# Patient Record
Sex: Female | Born: 1990 | Race: White | Hispanic: Yes | Marital: Married | Smoking: Never smoker
Health system: Southern US, Community
[De-identification: ages and names within clinical notes are randomized; demographics above are authoritative.]

## PROBLEM LIST (undated history)

## (undated) ENCOUNTER — Inpatient Hospital Stay (HOSPITAL_COMMUNITY): Payer: Self-pay

## (undated) DIAGNOSIS — T8331XA Breakdown (mechanical) of intrauterine contraceptive device, initial encounter: Secondary | ICD-10-CM

## (undated) DIAGNOSIS — O9989 Other specified diseases and conditions complicating pregnancy, childbirth and the puerperium: Secondary | ICD-10-CM

## (undated) DIAGNOSIS — A5901 Trichomonal vulvovaginitis: Secondary | ICD-10-CM

## (undated) DIAGNOSIS — K219 Gastro-esophageal reflux disease without esophagitis: Secondary | ICD-10-CM

## (undated) DIAGNOSIS — K8020 Calculus of gallbladder without cholecystitis without obstruction: Secondary | ICD-10-CM

## (undated) HISTORY — DX: Other specified diseases and conditions complicating pregnancy, childbirth and the puerperium: O99.89

## (undated) HISTORY — DX: Breakdown (mechanical) of intrauterine contraceptive device, initial encounter: T83.31XA

## (undated) HISTORY — DX: Trichomonal vulvovaginitis: A59.01

---

## 2017-01-04 DIAGNOSIS — T8331XA Breakdown (mechanical) of intrauterine contraceptive device, initial encounter: Secondary | ICD-10-CM

## 2017-01-04 DIAGNOSIS — Z331 Pregnant state, incidental: Secondary | ICD-10-CM

## 2017-01-04 HISTORY — DX: Breakdown (mechanical) of intrauterine contraceptive device, initial encounter: T83.31XA

## 2017-01-04 HISTORY — DX: Pregnant state, incidental: Z33.1

## 2017-01-06 ENCOUNTER — Inpatient Hospital Stay (HOSPITAL_COMMUNITY)
Admission: AD | Admit: 2017-01-06 | Discharge: 2017-01-06 | Disposition: A | Discharge status: Home / Self Care | Payer: Medicaid Other | Source: Ambulatory Visit | Attending: Obstetrics & Gynecology | Admitting: Obstetrics & Gynecology

## 2017-01-06 ENCOUNTER — Encounter (HOSPITAL_COMMUNITY): Payer: Self-pay | Admitting: *Deleted

## 2017-01-06 ENCOUNTER — Inpatient Hospital Stay (HOSPITAL_COMMUNITY): Payer: Medicaid Other

## 2017-01-06 DIAGNOSIS — Z30432 Encounter for removal of intrauterine contraceptive device: Secondary | ICD-10-CM

## 2017-01-06 DIAGNOSIS — Z3A01 Less than 8 weeks gestation of pregnancy: Secondary | ICD-10-CM | POA: Diagnosis not present

## 2017-01-06 DIAGNOSIS — Z3491 Encounter for supervision of normal pregnancy, unspecified, first trimester: Secondary | ICD-10-CM

## 2017-01-06 DIAGNOSIS — Z3401 Encounter for supervision of normal first pregnancy, first trimester: Secondary | ICD-10-CM | POA: Diagnosis not present

## 2017-01-06 DIAGNOSIS — Z3201 Encounter for pregnancy test, result positive: Secondary | ICD-10-CM | POA: Diagnosis not present

## 2017-01-06 DIAGNOSIS — T8331XA Breakdown (mechanical) of intrauterine contraceptive device, initial encounter: Secondary | ICD-10-CM

## 2017-01-06 DIAGNOSIS — O9989 Other specified diseases and conditions complicating pregnancy, childbirth and the puerperium: Secondary | ICD-10-CM | POA: Diagnosis not present

## 2017-01-06 DIAGNOSIS — O09891 Supervision of other high risk pregnancies, first trimester: Secondary | ICD-10-CM

## 2017-01-06 DIAGNOSIS — M549 Dorsalgia, unspecified: Secondary | ICD-10-CM | POA: Diagnosis present

## 2017-01-06 LAB — COMPREHENSIVE METABOLIC PANEL
ALK PHOS: 70 U/L (ref 38–126)
ALT: 24 U/L (ref 14–54)
ANION GAP: 8 (ref 5–15)
AST: 21 U/L (ref 15–41)
Albumin: 4 g/dL (ref 3.5–5.0)
BILIRUBIN TOTAL: 0.5 mg/dL (ref 0.3–1.2)
BUN: 11 mg/dL (ref 6–20)
CALCIUM: 9.2 mg/dL (ref 8.9–10.3)
CO2: 25 mmol/L (ref 22–32)
CREATININE: 0.59 mg/dL (ref 0.44–1.00)
Chloride: 101 mmol/L (ref 101–111)
Glucose, Bld: 101 mg/dL — ABNORMAL HIGH (ref 65–99)
Potassium: 4.2 mmol/L (ref 3.5–5.1)
Sodium: 134 mmol/L — ABNORMAL LOW (ref 135–145)
TOTAL PROTEIN: 7.8 g/dL (ref 6.5–8.1)

## 2017-01-06 LAB — URINALYSIS, ROUTINE W REFLEX MICROSCOPIC
Bilirubin Urine: NEGATIVE
GLUCOSE, UA: NEGATIVE mg/dL
KETONES UR: NEGATIVE mg/dL
NITRITE: NEGATIVE
Protein, ur: NEGATIVE mg/dL
SPECIFIC GRAVITY, URINE: 1.003 — AB (ref 1.005–1.030)
pH: 6 (ref 5.0–8.0)

## 2017-01-06 LAB — CBC WITH DIFFERENTIAL/PLATELET
BASOS PCT: 0 %
Basophils Absolute: 0 10*3/uL (ref 0.0–0.1)
Eosinophils Absolute: 0 10*3/uL (ref 0.0–0.7)
Eosinophils Relative: 0 %
HEMATOCRIT: 35.9 % — AB (ref 36.0–46.0)
Hemoglobin: 11.2 g/dL — ABNORMAL LOW (ref 12.0–15.0)
LYMPHS ABS: 1.5 10*3/uL (ref 0.7–4.0)
LYMPHS PCT: 17 %
MCH: 24.2 pg — ABNORMAL LOW (ref 26.0–34.0)
MCHC: 31.2 g/dL (ref 30.0–36.0)
MCV: 77.5 fL — AB (ref 78.0–100.0)
MONO ABS: 0.4 10*3/uL (ref 0.1–1.0)
MONOS PCT: 4 %
NEUTROS ABS: 7.3 10*3/uL (ref 1.7–7.7)
Neutrophils Relative %: 79 %
Platelets: 268 10*3/uL (ref 150–400)
RBC: 4.63 MIL/uL (ref 3.87–5.11)
RDW: 15.2 % (ref 11.5–15.5)
WBC: 9.3 10*3/uL (ref 4.0–10.5)

## 2017-01-06 LAB — POCT PREGNANCY, URINE: Preg Test, Ur: POSITIVE — AB

## 2017-01-06 LAB — HCG, QUANTITATIVE, PREGNANCY: HCG, BETA CHAIN, QUANT, S: 21400 m[IU]/mL — AB (ref <5)

## 2017-01-06 LAB — ABO/RH: ABO/RH(D): A POS

## 2017-01-06 MED ORDER — AZITHROMYCIN 250 MG PO TABS
500.0000 mg | ORAL_TABLET | Freq: Once | ORAL | Status: AC
  Administered 2017-01-06: 500 mg via ORAL
  Filled 2017-01-06: qty 2

## 2017-01-06 NOTE — MAU Note (Signed)
[place]  Has been checking strings and last felt it 2 weeks ago.  Has had periods with LMP 11/20/16.  Had a +pregnancy test done on Tuesday and again today at [facility].  C/o constant lower back pain she rates a 7/10 and intermittent left lower abdominal pain she rates a 4/10.  Denies vaginal bleeding, but states having some clear/white vaginal discharge.  No vaginal pain.

## 2017-01-06 NOTE — MAU Provider Note (Signed)
History     CSN: [id number]  Arrival date and time: 01/06/17 1229   None     Chief Complaint  Patient presents with  . Possible Pregnancy  . Contraception   26 yo with IUD in place and positive pregnancy test.   Had IUDput in in September of last year and has not been able to feel the strings in over 3 weeks. She is reporting 4/10  back pain and cramping. She reports that the cramping started this week. Positive UPT at home 6 days ago and today. No bleeding, but clear discharge.she has not seen the IUD come out. She is very annoyed that she might be pregnant duspite the IUD.   She has not tried any treatments for pain. It is associated with nausea but no vomiting.  OB History    Gravida Para Term Preterm AB Living   2 1 1  0 0 1   SAB TAB Ectopic Multiple Live Births   0 0 0 0 0      History reviewed. No pertinent past medical history.  History reviewed. No pertinent surgical history.  History reviewed. No pertinent family history.  Social History  Substance Use Topics  . Smoking status: Never Smoker  . Smokeless tobacco: Never Used  . Alcohol use No    Allergies: No Known Allergies  No prescriptions prior to admission.    Review of Systems  Constitutional: Negative for chills and fever.  Gastrointestinal: Positive for nausea. Negative for abdominal pain, blood in stool, diarrhea and vomiting.  Genitourinary: Positive for frequency. Negative for dysuria, urgency, vaginal bleeding, vaginal discharge and vaginal pain.  Musculoskeletal: Positive for back pain.  Neurological: Negative for dizziness, light-headedness and headaches.   Physical Exam   Blood pressure 120/81, pulse 93, temperature 98.4 F (36.9 C), temperature source Oral, resp. rate 18, last menstrual period 11/20/2016, SpO2 98 %.  Physical Exam  Constitutional: She is oriented to person, place, and time. She appears well-developed and well-nourished.  Eyes: Pupils are equal, round, and reactive to  light.  Cardiovascular: Regular rhythm and normal heart sounds.   Respiratory: Effort normal and breath sounds normal.  GI: Soft. Bowel sounds are normal. She exhibits no distension and no mass. There is no tenderness. There is no rebound and no guarding.  Genitourinary: No labial fusion. There is no rash, tenderness, lesion or injury on the right labia. There is no rash, tenderness, lesion or injury on the left labia. Cervix exhibits discharge. Cervix exhibits no motion tenderness and no friability. No erythema, tenderness or bleeding in the vagina. No foreign body in the vagina. No signs of injury around the vagina. No vaginal discharge found.  Neurological: She is alert and oriented to person, place, and time.  Skin: Skin is warm and dry.  Psychiatric: She has a normal mood and affect. Her behavior is normal. Judgment and thought content normal.    MAU Course  Procedures  MDM CBC microcytic anemia Hb 11.2 US 2729w5d IUP with IUD seen in the lower uterine segment. BHCG quant 21,400 ABORH A+ IUD removed Given one time dose of azithromycin   Assessment and Plan  Pt is 26 yo presenting with IUP despite use of ParaGard IUD. IUD was removed, and pt provided with azithromycin in the ER , for prophylaxis. Pt appears well and stable and is not at any acute risk. Pt to start prenatal care as desired. List of providers given. Pt was counseled on the increased risk for miscarriage and miscarriage precautions  were provided. Return precautions were provided.   Sullivan LoneBrannon L Inman 01/06/2017, 1:19 PM   MDM: I confirm that I have verified the information documented in the medical student's note and that I have also personally performed the physical exam and all medical decision making activities.  IUD Removal  Patient was in the dorsal lithotomy position, normal external genitalia was noted.  A speculum was placed in the patient's vagina, normal discharge was noted, no lesions. The multiparous cervix was  visualized, no lesions, no abnormal discharge.  The strings of the IUD were grasped and pulled using ring forceps. The IUD was removed in its entirety. Patient tolerated the procedure well.    Reviewed risks of miscarriage with pt, but risks reduced by removing IUD.  Pt to start prenatal care with provider of her choice.  A: 1. Normal IUP (intrauterine pregnancy) on prenatal ultrasound, first trimester   2. IUD pregnancy   3. Pregnancy occurring while using intrauterine contraceptive device (IUD) in first trimester     P: D/C home with miscarriage precautions Return to MAU as needed for emergencies   Sharen CounterLisa Leftwich-Kirby, CNM 8:48 PM

## 2017-01-06 NOTE — Discharge Instructions (Signed)
[facility] Providers  ° ° °[facility]       Phone: [phone] ° °[facility] Phone: [phone] ° °[facility] [place]  Phone: [phone] ° °[facility]  Phone: [phone] ° °[facility]  Phone: [phone] ° °[facility]       Phone: [phone] ° °[facility]    Phone: [phone]  ° °[facility]    Phone: [phone] ° °[facility]    Phone: [phone] ° °[facility]    Phone: [phone] ° °[facility]    Phone: [phone] ° °[facility]       Phone: [phone]  ° °[facility]  Phone: [phone] ° °[facility]    Phone: [phone] ° °[facility]   Phone: [phone] ° °[facility]      Phone: [phone] ° °[facility]    Phone: [phone] ° °

## 2017-01-08 LAB — CULTURE, OB URINE

## 2017-03-02 ENCOUNTER — Encounter: Payer: Self-pay | Admitting: Certified Nurse Midwife

## 2017-03-02 ENCOUNTER — Ambulatory Visit (INDEPENDENT_AMBULATORY_CARE_PROVIDER_SITE_OTHER): Payer: Medicaid Other | Admitting: Certified Nurse Midwife

## 2017-03-02 ENCOUNTER — Other Ambulatory Visit (HOSPITAL_COMMUNITY)
Admission: RE | Admit: 2017-03-02 | Discharge: 2017-03-02 | Disposition: A | Discharge status: Home / Self Care | Payer: Medicaid Other | Source: Ambulatory Visit | Attending: Certified Nurse Midwife | Admitting: Certified Nurse Midwife

## 2017-03-02 VITALS — BP 110/73 | HR 105 | Ht 62.0 in | Wt 196.6 lb

## 2017-03-02 DIAGNOSIS — R87629 Unspecified abnormal cytological findings in specimens from vagina: Secondary | ICD-10-CM | POA: Insufficient documentation

## 2017-03-02 DIAGNOSIS — Z113 Encounter for screening for infections with a predominantly sexual mode of transmission: Secondary | ICD-10-CM | POA: Diagnosis not present

## 2017-03-02 DIAGNOSIS — Z3480 Encounter for supervision of other normal pregnancy, unspecified trimester: Secondary | ICD-10-CM

## 2017-03-02 DIAGNOSIS — Z23 Encounter for immunization: Secondary | ICD-10-CM

## 2017-03-02 DIAGNOSIS — Z3481 Encounter for supervision of other normal pregnancy, first trimester: Secondary | ICD-10-CM | POA: Diagnosis present

## 2017-03-02 DIAGNOSIS — Z3A00 Weeks of gestation of pregnancy not specified: Secondary | ICD-10-CM | POA: Insufficient documentation

## 2017-03-02 DIAGNOSIS — Z124 Encounter for screening for malignant neoplasm of cervix: Secondary | ICD-10-CM

## 2017-03-02 DIAGNOSIS — N879 Dysplasia of cervix uteri, unspecified: Secondary | ICD-10-CM | POA: Insufficient documentation

## 2017-03-02 DIAGNOSIS — Z3689 Encounter for other specified antenatal screening: Secondary | ICD-10-CM

## 2017-03-02 DIAGNOSIS — O0990 Supervision of high risk pregnancy, unspecified, unspecified trimester: Secondary | ICD-10-CM | POA: Insufficient documentation

## 2017-03-02 NOTE — Progress Notes (Signed)
FLU SHOT TODAY   NEW OB PACKET GIVEN BREAST EDUCATION DISCUSS LAST PAP SEPT2017 PH[facility] COMPLETED AND BILLED

## 2017-03-02 NOTE — Progress Notes (Signed)
Subjective:  Brittany Kerr is a 26 y.o. G3P2001 at [redacted]w[redacted]d being seen today for initial prenatal care.  She is currently monitored for the following issues for this low-risk pregnancy and has Supervision of other normal pregnancy, antepartum and Abnormal vaginal Pap smear on her problem list.  Patient reports mornig sickness, no vomiting.   .  .  Movement: Present. Denies leaking of fluid.   The following portions of the patient's history were reviewed and updated as appropriate: allergies, current medications, past family history, past medical history, past social history, past surgical history and problem list. Problem list updated.  Objective:   Vitals:   03/02/17 0936 03/02/17 0936  BP: 110/73   Pulse: (!) 105   Weight: 196 lb 9.6 oz (89.2 kg)   Height:   (1.575 m)    Fetal Status: Fetal Heart Rate (bpm): 160   Movement: Present     General:  Alert, oriented and cooperative. Patient is in no acute distress.  Skin: Skin is warm and dry. No rash noted.   Cardiovascular: RRR  Respiratory: CTAB  Abdomen: Soft, gravid, appropriate for gestational age. Pain/Pressure: Absent     Pelvic:       Cervical exam deferred        Extremities: Normal range of motion.     Mental Status: Normal mood and affect. Normal behavior. Normal judgment and thought content.  Breasts: breasts appear normal, no suspicious masses, no skin or nipple changes or axillary nodes, right breast normal without mass, skin or nipple changes or axillary nodes, left breast normal without mass, skin or nipple changes or axillary nodes.  Urinalysis:      Assessment and Plan:  Pregnancy: G3P2001 at [redacted]w[redacted]d  1. Supervision of other normal pregnancy, antepartum - Obstetric Panel, Including HIV - Hemoglobinopathy Evaluation - Culture, OB Urine - Cytology - PAP - [place] MFM OB COMP + 14 WK; Future - Babyscripts Schedule Optimization - Enroll Patient in Babyscripts  2. Abnormal vaginal Pap smear - Cytology - PAP  3.  Need for immunization against influenza - Flu Vaccine QUAD 6+ mos IM (Fluarix)  4. Screening, antenatal, for fetal anatomic survey - [place] MFM OB COMP + 14 WK; Future  Preterm labor symptoms and general obstetric precautions including but not limited to vaginal bleeding, contractions, leaking of fluid and fetal movement were reviewed in detail with the patient. Please refer to After Visit Summary for other counseling recommendations.  Return in about 5 weeks (around 04/06/2017).   Donette Larry, CNM

## 2017-03-02 NOTE — Patient Instructions (Signed)

## 2017-03-04 LAB — CULTURE, OB URINE

## 2017-03-04 LAB — URINE CULTURE, OB REFLEX

## 2017-03-06 LAB — OBSTETRIC PANEL, INCLUDING HIV
ANTIBODY SCREEN: NEGATIVE
BASOS ABS: 0 10*3/uL (ref 0.0–0.2)
BASOS: 0 %
EOS (ABSOLUTE): 0 10*3/uL (ref 0.0–0.4)
Eos: 1 %
HIV SCREEN 4TH GENERATION: NONREACTIVE
Hematocrit: 35 % (ref 34.0–46.6)
Hemoglobin: 11.2 g/dL (ref 11.1–15.9)
Hepatitis B Surface Ag: NEGATIVE
Immature Grans (Abs): 0 10*3/uL (ref 0.0–0.1)
Immature Granulocytes: 0 %
LYMPHS ABS: 1 10*3/uL (ref 0.7–3.1)
Lymphs: 13 %
MCH: 24 pg — AB (ref 26.6–33.0)
MCHC: 32 g/dL (ref 31.5–35.7)
MCV: 75 fL — AB (ref 79–97)
Monocytes Absolute: 0.4 10*3/uL (ref 0.1–0.9)
Monocytes: 6 %
NEUTROS ABS: 6 10*3/uL (ref 1.4–7.0)
Neutrophils: 80 %
PLATELETS: 268 10*3/uL (ref 150–379)
RBC: 4.67 x10E6/uL (ref 3.77–5.28)
RDW: 16.6 % — AB (ref 12.3–15.4)
RPR Ser Ql: NONREACTIVE
Rh Factor: POSITIVE
Rubella Antibodies, IGG: 1.42 index (ref >0.99)
WBC: 7.5 10*3/uL (ref 3.4–10.8)

## 2017-03-06 LAB — HEMOGLOBINOPATHY EVALUATION
FERRITIN: 10 ng/mL — AB (ref 15–150)
HGB C: 0 %
HGB SOLUBILITY: NEGATIVE
HGB VARIANT: 0 %
Hgb A2 Quant: 2.3 % (ref 1.8–3.2)
Hgb A: 97.7 % (ref 96.4–98.8)
Hgb F Quant: 0 % (ref 0.0–2.0)
Hgb S: 0 %

## 2017-03-06 LAB — CYTOLOGY - PAP
Chlamydia: NEGATIVE
DIAGNOSIS: NEGATIVE
Neisseria Gonorrhea: NEGATIVE

## 2017-03-07 ENCOUNTER — Encounter: Payer: Self-pay | Admitting: Obstetrics and Gynecology

## 2017-03-07 DIAGNOSIS — T8331XA Breakdown (mechanical) of intrauterine contraceptive device, initial encounter: Secondary | ICD-10-CM | POA: Insufficient documentation

## 2017-03-07 DIAGNOSIS — O9989 Other specified diseases and conditions complicating pregnancy, childbirth and the puerperium: Secondary | ICD-10-CM

## 2017-04-04 ENCOUNTER — Encounter (HOSPITAL_COMMUNITY): Payer: Self-pay | Admitting: Certified Nurse Midwife

## 2017-04-05 DIAGNOSIS — A5901 Trichomonal vulvovaginitis: Secondary | ICD-10-CM | POA: Insufficient documentation

## 2017-04-05 HISTORY — DX: Trichomonal vulvovaginitis: A59.01

## 2017-04-06 ENCOUNTER — Ambulatory Visit (INDEPENDENT_AMBULATORY_CARE_PROVIDER_SITE_OTHER): Payer: Medicaid Other | Admitting: Student

## 2017-04-06 ENCOUNTER — Encounter: Payer: Self-pay | Admitting: Student

## 2017-04-06 VITALS — BP 111/62 | HR 87 | Wt 205.0 lb

## 2017-04-06 DIAGNOSIS — D509 Iron deficiency anemia, unspecified: Secondary | ICD-10-CM

## 2017-04-06 DIAGNOSIS — D649 Anemia, unspecified: Secondary | ICD-10-CM | POA: Insufficient documentation

## 2017-04-06 DIAGNOSIS — A5901 Trichomonal vulvovaginitis: Secondary | ICD-10-CM

## 2017-04-06 DIAGNOSIS — Z3480 Encounter for supervision of other normal pregnancy, unspecified trimester: Secondary | ICD-10-CM

## 2017-04-06 MED ORDER — FERROUS SULFATE 325 (65 FE) MG PO TBEC: 325.0000 mg | DELAYED_RELEASE_TABLET | Freq: Every day | ORAL | 3 refills | Status: DC

## 2017-04-06 MED ORDER — METOCLOPRAMIDE HCL 10 MG PO TABS: 10.0000 mg | ORAL_TABLET | Freq: Four times a day (QID) | ORAL | 0 refills | Status: DC

## 2017-04-06 MED ORDER — METRONIDAZOLE 500 MG PO TABS: 2000.0000 mg | ORAL_TABLET | Freq: Once | ORAL | 0 refills | Status: AC

## 2017-04-06 MED ORDER — FERROUS SULFATE 325 (65 FE) MG PO TBEC: 325.0000 mg | DELAYED_RELEASE_TABLET | Freq: Three times a day (TID) | ORAL | 3 refills | Status: DC

## 2017-04-06 MED ORDER — RANITIDINE HCL 75 MG PO TABS: 75.0000 mg | ORAL_TABLET | Freq: Two times a day (BID) | ORAL | 3 refills | Status: DC

## 2017-04-06 NOTE — Patient Instructions (Signed)

## 2017-04-06 NOTE — Progress Notes (Signed)
   PRENATAL VISIT NOTE  Subjective:  Brittany Kerr is a 26 y.o. G2P1001 at 6148w4d being seen today for ongoing prenatal care.  She is currently monitored for the following issues for this low-risk pregnancy and has Supervision of other normal pregnancy, antepartum; Abnormal vaginal Pap smear; IUD pregnancy; Trichomoniasis of vagina; and Anemia on her problem list.  Patient reports backache. Some GERD, requesting zantac.  . Vag. Bleeding: None.  Movement: Present. Denies leaking of fluid.   The following portions of the patient's history were reviewed and updated as appropriate: allergies, current medications, past family history, past medical history, past social history, past surgical history and problem list. Problem list updated.  Objective:   Vitals:   04/06/17 1342  BP: 111/62  Pulse: 87  Weight: 205 lb (93 kg)    Fetal Status: Fetal Heart Rate (bpm): 158   Movement: Present     General:  Alert, oriented and cooperative. Patient is in no acute distress.  Skin: Skin is warm and dry. No rash noted.   Cardiovascular: Normal heart rate noted  Respiratory: Normal respiratory effort, no problems with respiration noted  Abdomen: Soft, gravid, appropriate for gestational age.  Pain/Pressure: Absent     Pelvic: Cervical exam deferred        Extremities: Normal range of motion.  Edema: Trace  Mental Status:  Normal mood and affect. Normal behavior. Normal judgment and thought content.   Assessment and Plan:  Pregnancy: G2P1001 at 7348w4d  1. Supervision of other normal pregnancy, antepartum  - AFP TETRA  2. Trichomoniasis of vagina -2 grams flagyl ordered plus Reglan -recommended patient talk with husband and have him tested and treated; educated patient on importance of abstaining from intercourse until husband had been treated.   3. Iron deficiency anemia, unspecified iron deficiency anemia type -started on iron  Preterm labor symptoms and general obstetric precautions  including but not limited to vaginal bleeding, contractions, leaking of fluid and fetal movement were reviewed in detail with the patient. Please refer to After Visit Summary for other counseling recommendations.  Return in about 4 weeks (around 05/04/2017) for ROB.   Marylene LandKathryn Lorraine Kooistra, CNM

## 2017-04-09 LAB — AFP TETRA
DIA Mom Value: 0.65
DIA Value (EIA): 91.71 pg/mL
DSR (By Age)    1 IN: 1026
DSR (Second Trimester) 1 IN: 10000
Gestational Age: 18.4 WEEKS
MSAFP MOM: 0.81
MSAFP: 30.3 ng/mL
MSHCG MOM: 0.66
MSHCG: 14196 m[IU]/mL
Maternal Age At EDD: 25.1 yr
OSB RISK: 10000
Test Results:: NEGATIVE
UE3 MOM: 1.06
UE3 VALUE: 1.34 ng/mL
WEIGHT: 205 [lb_av]

## 2017-04-12 ENCOUNTER — Other Ambulatory Visit: Payer: Self-pay | Admitting: Certified Nurse Midwife

## 2017-04-12 ENCOUNTER — Ambulatory Visit (HOSPITAL_COMMUNITY)
Admission: RE | Admit: 2017-04-12 | Discharge: 2017-04-12 | Disposition: A | Discharge status: Home / Self Care | Payer: Medicaid Other | Source: Ambulatory Visit | Attending: Certified Nurse Midwife | Admitting: Certified Nurse Midwife

## 2017-04-12 DIAGNOSIS — Z3A19 19 weeks gestation of pregnancy: Secondary | ICD-10-CM

## 2017-04-12 DIAGNOSIS — Z363 Encounter for antenatal screening for malformations: Secondary | ICD-10-CM

## 2017-04-12 DIAGNOSIS — O99210 Obesity complicating pregnancy, unspecified trimester: Secondary | ICD-10-CM

## 2017-04-12 DIAGNOSIS — Z3689 Encounter for other specified antenatal screening: Secondary | ICD-10-CM

## 2017-04-12 DIAGNOSIS — Z3480 Encounter for supervision of other normal pregnancy, unspecified trimester: Secondary | ICD-10-CM

## 2017-04-12 DIAGNOSIS — O99212 Obesity complicating pregnancy, second trimester: Secondary | ICD-10-CM | POA: Diagnosis present

## 2017-05-04 ENCOUNTER — Encounter: Payer: Medicaid Other | Admitting: Student

## 2017-05-08 ENCOUNTER — Ambulatory Visit (INDEPENDENT_AMBULATORY_CARE_PROVIDER_SITE_OTHER): Payer: Medicaid Other | Admitting: Obstetrics & Gynecology

## 2017-05-08 VITALS — BP 122/56 | HR 91 | Wt 209.7 lb

## 2017-05-08 DIAGNOSIS — Z3480 Encounter for supervision of other normal pregnancy, unspecified trimester: Secondary | ICD-10-CM

## 2017-05-08 DIAGNOSIS — Z3483 Encounter for supervision of other normal pregnancy, third trimester: Secondary | ICD-10-CM

## 2017-05-08 DIAGNOSIS — R87629 Unspecified abnormal cytological findings in specimens from vagina: Secondary | ICD-10-CM

## 2017-05-08 NOTE — Progress Notes (Signed)
   PRENATAL VISIT NOTE  Subjective:  Brittany Kerr is a 26 y.o. G2P1001 at 5725w1d being seen today for ongoing prenatal care.  She is currently monitored for the following issues for this low-risk pregnancy and has Supervision of other normal pregnancy, antepartum; Abnormal vaginal Pap smear; IUD pregnancy; Trichomoniasis of vagina; and Anemia on their problem list.  Patient reports no complaints.  Contractions: Not present. Vag. Bleeding: None.  Movement: Present. Denies leaking of fluid.   The following portions of the patient's history were reviewed and updated as appropriate: allergies, current medications, past family history, past medical history, past social history, past surgical history and problem list. Problem list updated.  Objective:   Vitals:   05/08/17 1044  BP: (!) 122/56  Pulse: 91  Weight: 209 lb 11.2 oz (95.1 kg)    Fetal Status: Fetal Heart Rate (bpm): 155   Movement: Present     General:  Alert, oriented and cooperative. Patient is in no acute distress.  Skin: Skin is warm and dry. No rash noted.   Cardiovascular: Normal heart rate noted  Respiratory: Normal respiratory effort, no problems with respiration noted  Abdomen: Soft, gravid, appropriate for gestational age.  Pain/Pressure: Absent     Pelvic: Cervical exam deferred        Extremities: Normal range of motion.  Edema: Trace  Mental Status:  Normal mood and affect. Normal behavior. Normal judgment and thought content.   Assessment and Plan:  Pregnancy: G2P1001 at 2725w1d  1. Supervision of other normal pregnancy, antepartum   2. Abnormal vaginal Pap smear   Preterm labor symptoms and general obstetric precautions including but not limited to vaginal bleeding, contractions, leaking of fluid and fetal movement were reviewed in detail with the patient. Please refer to After Visit Summary for other counseling recommendations.  No Follow-up on file.   Allie BossierMyra C Micaila Ziemba, MD

## 2017-05-08 NOTE — Progress Notes (Signed)
States has never felt much fetal movement- not feeling everyday.

## 2017-06-05 ENCOUNTER — Ambulatory Visit (INDEPENDENT_AMBULATORY_CARE_PROVIDER_SITE_OTHER): Payer: Medicaid Other | Admitting: Obstetrics and Gynecology

## 2017-06-05 ENCOUNTER — Encounter: Payer: Self-pay | Admitting: Obstetrics and Gynecology

## 2017-06-05 ENCOUNTER — Other Ambulatory Visit (HOSPITAL_COMMUNITY)
Admission: RE | Admit: 2017-06-05 | Discharge: 2017-06-05 | Disposition: A | Discharge status: Home / Self Care | Payer: Medicaid Other | Source: Ambulatory Visit | Attending: Obstetrics and Gynecology | Admitting: Obstetrics and Gynecology

## 2017-06-05 VITALS — BP 116/64 | HR 94 | Wt 216.1 lb

## 2017-06-05 DIAGNOSIS — Z3480 Encounter for supervision of other normal pregnancy, unspecified trimester: Secondary | ICD-10-CM

## 2017-06-05 DIAGNOSIS — A5901 Trichomonal vulvovaginitis: Secondary | ICD-10-CM | POA: Diagnosis not present

## 2017-06-05 DIAGNOSIS — Z3A00 Weeks of gestation of pregnancy not specified: Secondary | ICD-10-CM | POA: Insufficient documentation

## 2017-06-05 DIAGNOSIS — Z3482 Encounter for supervision of other normal pregnancy, second trimester: Secondary | ICD-10-CM | POA: Diagnosis present

## 2017-06-05 DIAGNOSIS — N879 Dysplasia of cervix uteri, unspecified: Secondary | ICD-10-CM

## 2017-06-05 DIAGNOSIS — Z23 Encounter for immunization: Secondary | ICD-10-CM | POA: Diagnosis not present

## 2017-06-05 DIAGNOSIS — O98819 Other maternal infectious and parasitic diseases complicating pregnancy, unspecified trimester: Secondary | ICD-10-CM | POA: Insufficient documentation

## 2017-06-05 NOTE — Progress Notes (Signed)
Prenatal Visit Note Date: 06/05/2017 Clinic: [facility]  Subjective:  Brittany Kerr is a 26 y.o. G2P1001 at 6715w1d being seen today for ongoing prenatal care.  She is currently monitored for the following issues for this low-risk pregnancy and has Supervision of other normal pregnancy, antepartum; Cervical dysplasia; IUD pregnancy; Trichomoniasis of vagina; and Anemia on their problem list.  Patient reports no complaints.   Contractions: Not present. Vag. Bleeding: None.  Movement: Present. Denies leaking of fluid.   The following portions of the patient's history were reviewed and updated as appropriate: allergies, current medications, past family history, past medical history, past social history, past surgical history and problem list. Problem list updated.  Objective:   Vitals:   06/05/17 0816  BP: 116/64  Pulse: 94  Weight: 216 lb 1.6 oz (98 kg)    Fetal Status: Fetal Heart Rate (bpm): 143 Fundal Height: 28 cm Movement: Present     General:  Alert, oriented and cooperative. Patient is in no acute distress.  Skin: Skin is warm and dry. No rash noted.   Cardiovascular: Normal heart rate noted  Respiratory: Normal respiratory effort, no problems with respiration noted  Abdomen: Soft, gravid, appropriate for gestational age. Pain/Pressure: Present     Pelvic:  Cervical exam deferred        Extremities: Normal range of motion.  Edema: Trace  Mental Status: Normal mood and affect. Normal behavior. Normal judgment and thought content.   Urinalysis:      Assessment and Plan:  Pregnancy: G2P1001 at 7115w1d  1. Supervision of other normal pregnancy, antepartum Routine care - Glucose Tolerance, 2 Hours w/1 Hour - HIV antibody (with reflex) - CBC - RPR - Tdap vaccine greater than or equal to 7yo IM - Cervicovaginal ancillary only  2. Trichomoniasis of vagina toc today - Cervicovaginal ancillary only   Preterm labor symptoms and general obstetric  precautions including but not limited to vaginal bleeding, contractions, leaking of fluid and fetal movement were reviewed in detail with the patient. Please refer to After Visit Summary for other counseling recommendations.  Return in about 2 weeks (around 06/19/2017).   Shiprock BingPickens, Yutaka Holberg, MD

## 2017-06-06 LAB — CBC
HEMATOCRIT: 31.6 % — AB (ref 34.0–46.6)
Hemoglobin: 10.2 g/dL — ABNORMAL LOW (ref 11.1–15.9)
MCH: 23.2 pg — ABNORMAL LOW (ref 26.6–33.0)
MCHC: 32.3 g/dL (ref 31.5–35.7)
MCV: 72 fL — ABNORMAL LOW (ref 79–97)
Platelets: 244 10*3/uL (ref 150–379)
RBC: 4.4 x10E6/uL (ref 3.77–5.28)
RDW: 16.8 % — AB (ref 12.3–15.4)
WBC: 8.5 10*3/uL (ref 3.4–10.8)

## 2017-06-06 LAB — GLUCOSE TOLERANCE, 2 HOURS W/ 1HR
Glucose, 1 hour: 178 mg/dL (ref 65–179)
Glucose, 2 hour: 111 mg/dL (ref 65–152)
Glucose, Fasting: 94 mg/dL — ABNORMAL HIGH (ref 65–91)

## 2017-06-06 LAB — HIV ANTIBODY (ROUTINE TESTING W REFLEX): HIV SCREEN 4TH GENERATION: NONREACTIVE

## 2017-06-06 LAB — RPR: RPR Ser Ql: NONREACTIVE

## 2017-06-06 NOTE — L&D Delivery Note (Signed)
Patient is a 27 y.o. now [id number] s/p NSVD at 5377w0d, who was admitted for IOL for A2GDM.  She progressed with augmentation to complete and pushed minutes to deliver.  Cord clamping delayed by 1 minute then clamped by me with supervision and cut by father of the baby.  Placenta intact and spontaneous, bleeding minimal.  2nd degree perineal laceration repaired without difficulty under supervision. She requests BTL for birth control.  Delivery Note At 10:23 PM a viable female was delivered via Vaginal, Spontaneous (Presentation: ROP) in usual fashion.  APGAR: 9, 9; weight pending.   Placenta status: intact.  Cord: 3V with the following complications: None.  Cord pH: N/A  Anesthesia: Epidural Episiotomy: None Lacerations: 2nd degree Suture Repair: 3.0 vicryl Est. Blood Loss (mL):  150  Mom to postpartum.  Baby to Couplet care / Skin to Skin.  Ellwood DenseAlison Stratton Villwock, DO 08/27/17, 10:57 PM

## 2017-06-07 ENCOUNTER — Telehealth: Payer: Self-pay | Admitting: General Practice

## 2017-06-07 ENCOUNTER — Encounter: Payer: Self-pay | Admitting: Obstetrics and Gynecology

## 2017-06-07 DIAGNOSIS — O24419 Gestational diabetes mellitus in pregnancy, unspecified control: Secondary | ICD-10-CM | POA: Insufficient documentation

## 2017-06-07 DIAGNOSIS — O24410 Gestational diabetes mellitus in pregnancy, diet controlled: Secondary | ICD-10-CM

## 2017-06-07 LAB — CERVICOVAGINAL ANCILLARY ONLY
Chlamydia: NEGATIVE
Neisseria Gonorrhea: NEGATIVE
Trichomonas: POSITIVE — AB

## 2017-06-07 MED ORDER — ACCU-CHEK GUIDE W/DEVICE KIT: 1.0000 | PACK | Freq: Once | 0 refills | Status: AC

## 2017-06-07 MED ORDER — ACCU-CHEK FASTCLIX LANCETS MISC
1.0000 | Freq: Four times a day (QID) | 12 refills | Status: DC
Start: 2017-06-07 — End: 2017-08-29

## 2017-06-07 MED ORDER — GLUCOSE BLOOD VI STRP: ORAL_STRIP | 12 refills | Status: DC

## 2017-06-07 NOTE — Telephone Encounter (Signed)
-----   Message from Robert Lee Bingharlie Pickens, MD sent at 06/07/2017  8:46 AM EST ----- Please let her know that she has gdm and set her up with the usual protocol. thanks

## 2017-06-07 NOTE — Telephone Encounter (Signed)
Called and informed patient of results, recommended appt, & testing supplies Rx sent to pharmacy to bring to appt. Patient verbalized understanding to all & states she can come 1/8 @ 11am. Patient had no questions

## 2017-06-08 ENCOUNTER — Other Ambulatory Visit: Payer: Self-pay | Admitting: Obstetrics and Gynecology

## 2017-06-08 MED ORDER — METRONIDAZOLE 500 MG PO TABS: 500.0000 mg | ORAL_TABLET | Freq: Two times a day (BID) | ORAL | 0 refills | Status: AC

## 2017-06-13 ENCOUNTER — Ambulatory Visit: Payer: Medicaid Other | Admitting: *Deleted

## 2017-06-13 ENCOUNTER — Encounter: Payer: Medicaid Other | Attending: Obstetrics and Gynecology | Admitting: *Deleted

## 2017-06-13 DIAGNOSIS — Z713 Dietary counseling and surveillance: Secondary | ICD-10-CM | POA: Diagnosis not present

## 2017-06-13 DIAGNOSIS — O99810 Abnormal glucose complicating pregnancy: Secondary | ICD-10-CM | POA: Diagnosis not present

## 2017-06-13 DIAGNOSIS — Z3A00 Weeks of gestation of pregnancy not specified: Secondary | ICD-10-CM | POA: Insufficient documentation

## 2017-06-13 DIAGNOSIS — R7309 Other abnormal glucose: Secondary | ICD-10-CM

## 2017-06-13 NOTE — Progress Notes (Signed)
  Patient was seen on 06/13/2017 for Gestational Diabetes self-management . No history of GDM, EDD 09/03/2017. Per diet history she eats a good variety of all food groups, no sweet beverages.  The following learning objectives were met by the patient :   States the definition of Gestational Diabetes  States why dietary management is important in controlling blood glucose  Describes the effects of carbohydrates on blood glucose levels  Demonstrates ability to create a balanced meal plan  Demonstrates carbohydrate counting   States when to check blood glucose levels  Demonstrates proper blood glucose monitoring techniques  States the effect of stress and exercise on blood glucose levels  States the importance of limiting caffeine and abstaining from alcohol and smoking  Patient interested in [employer], she is now signed up  Plan:  Aim for 3 Carb Choices per meal (45 grams) +/- 1 either way  Aim for 1-2 Carbs per snack Begin reading food labels for Total Carbohydrate of foods Consider  increasing your activity level by walking or other activity daily as tolerated Begin checking BG before breakfast and 2 hours after first bite of breakfast, lunch and dinner as directed by MD  Bring Log Book to every medical appointment   Take medication if directed by MD  Patient already has a meter: Accu Chek Guide already obtained, I showed her how to use it. And is testing pre breakfast and 2 hours each meal as directed by MD Patient states she would like to use Babyscripts to record BG  Patient instructed to monitor glucose levels: FBS: 60 - 95 mg/dl 2 hour: <120 mg/dl  Patient received the following handouts:  Nutrition Diabetes and Pregnancy  Carbohydrate Counting List  Patient will be seen for follow-up as needed.

## 2017-06-26 ENCOUNTER — Ambulatory Visit (INDEPENDENT_AMBULATORY_CARE_PROVIDER_SITE_OTHER): Payer: Medicaid Other | Admitting: Advanced Practice Midwife

## 2017-06-26 VITALS — BP 129/60 | HR 83

## 2017-06-26 DIAGNOSIS — O24410 Gestational diabetes mellitus in pregnancy, diet controlled: Secondary | ICD-10-CM

## 2017-06-26 DIAGNOSIS — Z3480 Encounter for supervision of other normal pregnancy, unspecified trimester: Secondary | ICD-10-CM

## 2017-06-26 DIAGNOSIS — O0993 Supervision of high risk pregnancy, unspecified, third trimester: Secondary | ICD-10-CM

## 2017-06-26 NOTE — Assessment & Plan Note (Signed)
Guidelines for Antenatal Testing and Sonography  (with updated ICD-10 codes) INDICATION U/S NST/AFI DELIVERY  Diabetes   A1 - good control - O24.410    A2 - good control - O24.419      A2  - poor control or poor compliance - O24.419, E11.65   (Macrosomia or polyhydramnios) **E11.65 is extra code for poor control**    A2/B - O24.919  and B-C O24.319  Poor control B-C or D-R-F-T - O24.319  or  Type I DM - O24.019  20-38  20-38  20-24-28-32-36   20-24-28-32-35-38//fetal echo  20-24-27-30-33-36-38//fetal echo  40  32//2 x wk or BPP wkly  32//2 x wk or BPP wkly   32//2 x wk or BPP wkly  28//BPP wkly then 32//2 x wk or BPP wkly  40  39  PRN   39  PRN

## 2017-06-26 NOTE — Progress Notes (Signed)
   PRENATAL VISIT NOTE  Subjective:  Brittany Kerr is a 27 y.o. G2P1001 at 2551w1d being seen today for ongoing prenatal care.  She is currently monitored for the following issues for this high-risk pregnancy and has Supervision of other normal pregnancy, antepartum; Cervical dysplasia; IUD pregnancy; Trichomoniasis of vagina; Anemia; and GDM (gestational diabetes mellitus) on their problem list.  Patient reports no complaints.  Contractions: Not present. Vag. Bleeding: None.  Movement: Present. Denies leaking of fluid.   The following portions of the patient's history were reviewed and updated as appropriate: allergies, current medications, past family history, past medical history, past social history, past surgical history and problem list. Problem list updated.  Objective:   Vitals:   06/26/17 1512  BP: 129/60  Pulse: 83    Fetal Status: Fetal Heart Rate (bpm): 150 Fundal Height: 31 cm Movement: Present     General:  Alert, oriented and cooperative. Patient is in no acute distress.  Skin: Skin is warm and dry. No rash noted.   Cardiovascular: Normal heart rate noted  Respiratory: Normal respiratory effort, no problems with respiration noted  Abdomen: Soft, gravid, appropriate for gestational age.  Pain/Pressure: Absent     Pelvic: Cervical exam deferred        Extremities: Normal range of motion.  Edema: None  Mental Status:  Normal mood and affect. Normal behavior. Normal judgment and thought content.    Reviewed CBG log, all fasting levels are >100 Postprandial: most about 120.  Discussed diet measures, but may need medication.   Assessment and Plan:  Pregnancy: G2P1001 at 2551w1d  1. Diet controlled gestational diabetes mellitus (GDM) in third trimester - Discussed changing bedtime snack to high protein v carb heavy snack - eat bed time snack earlier in the night   2. Supervision of high risk pregnancy in third trimester      Guidelines for Antenatal Testing and  Sonography  (with updated ICD-10 codes) INDICATION U/S NST/AFI DELIVERY  Diabetes   A1 - good control - O24.410    A2 - good control - O24.419      A2  - poor control or poor compliance - O24.419, E11.65   (Macrosomia or polyhydramnios) **E11.65 is extra code for poor control**    A2/B - O24.919  and B-C O24.319  Poor control B-C or D-R-F-T - O24.319  or  Type I DM - O24.019  20-38  20-38  20-24-28-32-36   20-24-28-32-35-38//fetal echo  20-24-27-30-33-36-38//fetal echo  40  32//2 x wk or BPP wkly  32//2 x wk or BPP wkly   32//2 x wk or BPP wkly  28//BPP wkly then 32//2 x wk or BPP wkly  40  39  PRN   39  PRN   Preterm labor symptoms and general obstetric precautions including but not limited to vaginal bleeding, contractions, leaking of fluid and fetal movement were reviewed in detail with the patient. Please refer to After Visit Summary for other counseling recommendations.  No Follow-up on file.   Thressa ShellerHeather Jereline Ticer, CNM

## 2017-07-10 ENCOUNTER — Other Ambulatory Visit (HOSPITAL_COMMUNITY)
Admission: RE | Admit: 2017-07-10 | Discharge: 2017-07-10 | Disposition: A | Discharge status: Home / Self Care | Payer: Medicaid Other | Source: Ambulatory Visit | Attending: Obstetrics and Gynecology | Admitting: Obstetrics and Gynecology

## 2017-07-10 ENCOUNTER — Ambulatory Visit (INDEPENDENT_AMBULATORY_CARE_PROVIDER_SITE_OTHER): Payer: Medicaid Other | Admitting: Obstetrics and Gynecology

## 2017-07-10 ENCOUNTER — Encounter: Payer: Medicaid Other | Admitting: Advanced Practice Midwife

## 2017-07-10 ENCOUNTER — Encounter: Payer: Self-pay | Admitting: Obstetrics and Gynecology

## 2017-07-10 VITALS — BP 119/49 | HR 83 | Wt 218.0 lb

## 2017-07-10 DIAGNOSIS — D649 Anemia, unspecified: Secondary | ICD-10-CM

## 2017-07-10 DIAGNOSIS — O24419 Gestational diabetes mellitus in pregnancy, unspecified control: Secondary | ICD-10-CM

## 2017-07-10 DIAGNOSIS — O99013 Anemia complicating pregnancy, third trimester: Secondary | ICD-10-CM | POA: Diagnosis not present

## 2017-07-10 DIAGNOSIS — A5901 Trichomonal vulvovaginitis: Secondary | ICD-10-CM | POA: Insufficient documentation

## 2017-07-10 DIAGNOSIS — Z3480 Encounter for supervision of other normal pregnancy, unspecified trimester: Secondary | ICD-10-CM

## 2017-07-10 LAB — POCT URINALYSIS DIP (DEVICE)
BILIRUBIN URINE: NEGATIVE
Glucose, UA: NEGATIVE mg/dL
HGB URINE DIPSTICK: NEGATIVE
Ketones, ur: NEGATIVE mg/dL
Leukocytes, UA: NEGATIVE
NITRITE: NEGATIVE
PH: 7 (ref 5.0–8.0)
PROTEIN: 30 mg/dL — AB
Specific Gravity, Urine: 1.02 (ref 1.005–1.030)
Urobilinogen, UA: 0.2 mg/dL (ref 0.0–1.0)

## 2017-07-10 MED ORDER — GLYBURIDE 2.5 MG PO TABS: ORAL_TABLET | ORAL | 4 refills | Status: DC

## 2017-07-10 NOTE — Progress Notes (Signed)
Prenatal Visit Note Date: 07/10/2017 Clinic: [facility]  Subjective:  Brittany Kerr is a 27 y.o. G2P1001 at 605w1d being seen today for ongoing prenatal care.  She is currently monitored for the following issues for this high-risk pregnancy and has Supervision of other normal pregnancy, antepartum; Cervical dysplasia; IUD pregnancy; Trichomoniasis of vagina; Anemia; and GDM (gestational diabetes mellitus) on their problem list.  Patient reports no complaints.   Contractions: Irritability. Vag. Bleeding: None.  Movement: Present. Denies leaking of fluid.   The following portions of the patient's history were reviewed and updated as appropriate: allergies, current medications, past family history, past medical history, past social history, past surgical history and problem list. Problem list updated.  Objective:   Vitals:   07/10/17 0913  BP: (!) 119/49  Pulse: 83  Weight: 218 lb (98.9 kg)    Fetal Status: Fetal Heart Rate (bpm): 147 Fundal Height: 33 cm Movement: Present     General:  Alert, oriented and cooperative. Patient is in no acute distress.  Skin: Skin is warm and dry. No rash noted.   Cardiovascular: Normal heart rate noted  Respiratory: Normal respiratory effort, no problems with respiration noted  Abdomen: Soft, gravid, appropriate for gestational age. Pain/Pressure: Present     Pelvic:  Cervical exam deferred        Extremities: Normal range of motion.  Edema: Trace  Mental Status: Normal mood and affect. Normal behavior. Normal judgment and thought content.   Urinalysis:      Assessment and Plan:  Pregnancy: G2P1001 at 505w1d  1. Trichomoniasis of vagina toc today - Cervicovaginal ancillary only  2. Supervision of high risk pregnancy Routine care. D/w pt re: bc nv  3. GDM, class A2 AM fastings in the 100s and 2hr PP dinners are consistently in the 120s. D/w her recommend starting glyburide 2.5 with dinner. Growth u/s and bpp for 2/6.   Will do nst today - US MFM OB FOLLOW UP; Future  Preterm labor symptoms and general obstetric precautions including but not limited to vaginal bleeding, contractions, leaking of fluid and fetal movement were reviewed in detail with the patient. Please refer to After Visit Summary for other counseling recommendations.  Return in about 9 days (around 07/19/2017) for nst/bpp/hrob visit.   [place] BingPickens, Brittany Delong, MD

## 2017-07-10 NOTE — Progress Notes (Signed)
Diabetes check

## 2017-07-11 LAB — CERVICOVAGINAL ANCILLARY ONLY
Chlamydia: NEGATIVE
Neisseria Gonorrhea: NEGATIVE
TRICH (WINDOWPATH): NEGATIVE

## 2017-07-12 ENCOUNTER — Other Ambulatory Visit: Payer: Self-pay | Admitting: Obstetrics and Gynecology

## 2017-07-12 ENCOUNTER — Ambulatory Visit (HOSPITAL_COMMUNITY)
Admission: RE | Admit: 2017-07-12 | Discharge: 2017-07-12 | Disposition: A | Discharge status: Home / Self Care | Payer: Medicaid Other | Source: Ambulatory Visit | Attending: Obstetrics and Gynecology | Admitting: Obstetrics and Gynecology

## 2017-07-12 ENCOUNTER — Encounter (HOSPITAL_COMMUNITY): Payer: Self-pay

## 2017-07-12 ENCOUNTER — Other Ambulatory Visit (HOSPITAL_COMMUNITY): Payer: Self-pay | Admitting: *Deleted

## 2017-07-12 DIAGNOSIS — Z3A32 32 weeks gestation of pregnancy: Secondary | ICD-10-CM | POA: Insufficient documentation

## 2017-07-12 DIAGNOSIS — O24415 Gestational diabetes mellitus in pregnancy, controlled by oral hypoglycemic drugs: Secondary | ICD-10-CM

## 2017-07-12 DIAGNOSIS — O24419 Gestational diabetes mellitus in pregnancy, unspecified control: Secondary | ICD-10-CM | POA: Diagnosis not present

## 2017-07-19 ENCOUNTER — Ambulatory Visit (HOSPITAL_COMMUNITY)
Admission: RE | Admit: 2017-07-19 | Discharge: 2017-07-19 | Disposition: A | Discharge status: Home / Self Care | Payer: Medicaid Other | Source: Ambulatory Visit | Attending: Obstetrics and Gynecology | Admitting: Obstetrics and Gynecology

## 2017-07-19 ENCOUNTER — Encounter (HOSPITAL_COMMUNITY): Payer: Self-pay

## 2017-07-19 ENCOUNTER — Ambulatory Visit (INDEPENDENT_AMBULATORY_CARE_PROVIDER_SITE_OTHER): Payer: Medicaid Other | Admitting: *Deleted

## 2017-07-19 ENCOUNTER — Ambulatory Visit (INDEPENDENT_AMBULATORY_CARE_PROVIDER_SITE_OTHER): Payer: Medicaid Other | Admitting: Obstetrics & Gynecology

## 2017-07-19 ENCOUNTER — Ambulatory Visit: Payer: Self-pay

## 2017-07-19 ENCOUNTER — Encounter: Payer: Self-pay | Admitting: *Deleted

## 2017-07-19 VITALS — BP 108/51 | HR 111 | Wt 218.0 lb

## 2017-07-19 DIAGNOSIS — O99613 Diseases of the digestive system complicating pregnancy, third trimester: Secondary | ICD-10-CM

## 2017-07-19 DIAGNOSIS — O24419 Gestational diabetes mellitus in pregnancy, unspecified control: Secondary | ICD-10-CM

## 2017-07-19 DIAGNOSIS — O0993 Supervision of high risk pregnancy, unspecified, third trimester: Secondary | ICD-10-CM

## 2017-07-19 DIAGNOSIS — K219 Gastro-esophageal reflux disease without esophagitis: Secondary | ICD-10-CM

## 2017-07-19 MED ORDER — GLYBURIDE 2.5 MG PO TABS: ORAL_TABLET | ORAL | 4 refills | Status: DC

## 2017-07-19 MED ORDER — FAMOTIDINE 20 MG PO TABS: 20.0000 mg | ORAL_TABLET | Freq: Two times a day (BID) | ORAL | 3 refills | Status: DC

## 2017-07-19 MED ORDER — RANITIDINE HCL 150 MG PO TABS: 150.0000 mg | ORAL_TABLET | Freq: Two times a day (BID) | ORAL | 4 refills | Status: DC

## 2017-07-19 NOTE — Patient Instructions (Signed)
Return to clinic for any scheduled appointments or obstetric concerns, or go to MAU for evaluation  

## 2017-07-19 NOTE — Progress Notes (Signed)

## 2017-07-19 NOTE — Progress Notes (Signed)
US for growth done 2/6. Pt states she is waking up @ 0200 each night with reflux. Pt also states she is having pain in upper mid abdomen 2-3 x/Marleny Faller.

## 2017-07-19 NOTE — Progress Notes (Signed)
   PRENATAL VISIT NOTE  Subjective:  Brittany Kerr is a 27 y.o. G2P1001 at 3150w3d being seen today for ongoing prenatal care.  She is currently monitored for the following issues for this high-risk pregnancy and has Supervision of high-risk pregnancy; Cervical dysplasia; IUD pregnancy; Anemia; and GDM, class A2 on their problem list.  Patient reports heartburn.  Contractions: Irregular. Vag. Bleeding: None.  Movement: Present. Denies leaking of fluid.   The following portions of the patient's history were reviewed and updated as appropriate: allergies, current medications, past family history, past medical history, past social history, past surgical history and problem list. Problem list updated.  Objective:   Vitals:   07/19/17 0836  BP: (!) 108/51  Pulse: (!) 111  Weight: 218 lb (98.9 kg)    Fetal Status: Fetal Heart Rate (bpm): NST Fundal Height: 34 cm Movement: Present     General:  Alert, oriented and cooperative. Patient is in no acute distress.  Skin: Skin is warm and dry. No rash noted.   Cardiovascular: Normal heart rate noted  Respiratory: Normal respiratory effort, no problems with respiration noted  Abdomen: Soft, gravid, appropriate for gestational age.  Pain/Pressure: Present     Pelvic: Cervical exam deferred        Extremities: Normal range of motion.  Edema: Trace  Mental Status:  Normal mood and affect. Normal behavior. Normal judgment and thought content.    Assessment and Plan:  Pregnancy: G2P1001 at 4050w3d  1. GDM, class A2 Elevated fastings in low 100s, increased Glyburide to 3.75 mg po qpm.  NST performed today was reviewed and was found to be reactive. Subsequent BPP performed today was also reviewed and was found to be 10/10. AFI was also normal. Continue recommended antenatal testing, growth scans and prenatal care.  - glyBURIDE (DIABETA) 2.5 MG tablet; One and a half tabs po with dinner  Dispense: 60 tablet; Refill: 4  2. Gastroesophageal reflux in  pregnancy in third trimester Medications prescribed - ranitidine (ZANTAC) 150 MG tablet; Take 1 tablet (150 mg total) by mouth 2 (two) times daily.  Dispense: 60 tablet; Refill: 4 - famotidine (PEPCID) 20 MG tablet; Take 1 tablet (20 mg total) by mouth 2 (two) times daily.  Dispense: 60 tablet; Refill: 3  3. Supervision of high risk pregnancy in third trimester Preterm labor symptoms and general obstetric precautions including but not limited to vaginal bleeding, contractions, leaking of fluid and fetal movement were reviewed in detail with the patient. Patient desires BTL.   Other reversible forms of contraception were discussed with patient in detail, also recommended vasectomy for her husband.  Risks of procedure discussed with patient including but not limited to: risk of regret given her young age, permanence of method, bleeding, infection, injury to surrounding organs and need for additional procedures.  Failure risk of 1-2 % with increased risk of ectopic gestation if pregnancy occurs was also discussed with patient.  Patient verbalized understanding of these risks and is still considering this modality.  Medicaid papers signed today. Please refer to After Visit Summary for other counseling recommendations.  Return in about 7 days (around 07/26/2017) for weekly as scheduled.   Jaynie CollinsUgonna Anyanwu, MD

## 2017-07-24 ENCOUNTER — Encounter: Payer: Medicaid Other | Admitting: Advanced Practice Midwife

## 2017-07-26 ENCOUNTER — Ambulatory Visit (INDEPENDENT_AMBULATORY_CARE_PROVIDER_SITE_OTHER): Payer: Medicaid Other | Admitting: Obstetrics and Gynecology

## 2017-07-26 ENCOUNTER — Ambulatory Visit (INDEPENDENT_AMBULATORY_CARE_PROVIDER_SITE_OTHER): Payer: Medicaid Other | Admitting: *Deleted

## 2017-07-26 ENCOUNTER — Ambulatory Visit: Payer: Self-pay

## 2017-07-26 ENCOUNTER — Other Ambulatory Visit (HOSPITAL_COMMUNITY): Payer: Medicaid Other

## 2017-07-26 VITALS — BP 108/65 | HR 95 | Wt 220.0 lb

## 2017-07-26 DIAGNOSIS — O24419 Gestational diabetes mellitus in pregnancy, unspecified control: Secondary | ICD-10-CM

## 2017-07-26 DIAGNOSIS — O0993 Supervision of high risk pregnancy, unspecified, third trimester: Secondary | ICD-10-CM

## 2017-07-26 NOTE — Progress Notes (Signed)

## 2017-07-26 NOTE — Progress Notes (Signed)
Prenatal Visit Note Date: 07/26/2017 Clinic: [facility]  Subjective:  [name] is a 27 y.o. G2P1001 at 8166w3d being seen today for ongoing prenatal care.  She is currently monitored for the following issues for this high-risk pregnancy and has Supervision of high-risk pregnancy; Cervical dysplasia; IUD pregnancy; Anemia; and GDM, class A2 on their problem list.  Patient reports no complaints.   Contractions: Irregular. Vag. Bleeding: None.  Movement: Present. Denies leaking of fluid.   The following portions of the patient's history were reviewed and updated as appropriate: allergies, current medications, past family history, past medical history, past social history, past surgical history and problem list. Problem list updated.  Objective:   Vitals:   07/26/17 1330  BP: 108/65  Pulse: 95  Weight: 220 lb (99.8 kg)    Fetal Status: Fetal Heart Rate (bpm): NST   Movement: Present  Presentation: Vertex  General:  Alert, oriented and cooperative. Patient is in no acute distress.  Skin: Skin is warm and dry. No rash noted.   Cardiovascular: Normal heart rate noted  Respiratory: Normal respiratory effort, no problems with respiration noted  Abdomen: Soft, gravid, appropriate for gestational age. Pain/Pressure: Present     Pelvic:  Cervical exam deferred        Extremities: Normal range of motion.  Edema: Trace  Mental Status: Normal mood and affect. Normal behavior. Normal judgment and thought content.   Urinalysis:      Assessment and Plan:  Pregnancy: G2P1001 at 4166w3d  1. Supervision of high risk pregnancy in third trimester Routine care. BTL papers signed last visit. GBS nv  2. GDM, class A2 Pt doing glyburide 5mg  po qhs. Normal BS log. BPP 10/10. Already has rpt growth scheduled -2/6: efw 80%, ac>97%   Preterm labor symptoms and general obstetric precautions including but not limited to vaginal bleeding, contractions, leaking of fluid and fetal  movement were reviewed in detail with the patient. Please refer to After Visit Summary for other counseling recommendations.  Return in about 1 week (around 08/02/2017) for hrob, nst and 2wk hrob, nst bpp.   [place] BingPickens, Gergory Biello, MD

## 2017-07-31 ENCOUNTER — Encounter: Payer: Medicaid Other | Admitting: Advanced Practice Midwife

## 2017-08-02 ENCOUNTER — Encounter: Payer: Medicaid Other | Admitting: Family Medicine

## 2017-08-02 ENCOUNTER — Ambulatory Visit (INDEPENDENT_AMBULATORY_CARE_PROVIDER_SITE_OTHER): Payer: Medicaid Other | Admitting: *Deleted

## 2017-08-02 ENCOUNTER — Ambulatory Visit (INDEPENDENT_AMBULATORY_CARE_PROVIDER_SITE_OTHER): Payer: Medicaid Other | Admitting: Obstetrics and Gynecology

## 2017-08-02 ENCOUNTER — Other Ambulatory Visit (HOSPITAL_COMMUNITY): Payer: Medicaid Other

## 2017-08-02 ENCOUNTER — Ambulatory Visit: Payer: Self-pay

## 2017-08-02 VITALS — BP 102/51 | HR 105 | Wt 220.0 lb

## 2017-08-02 DIAGNOSIS — O24419 Gestational diabetes mellitus in pregnancy, unspecified control: Secondary | ICD-10-CM

## 2017-08-02 DIAGNOSIS — O0993 Supervision of high risk pregnancy, unspecified, third trimester: Secondary | ICD-10-CM

## 2017-08-02 NOTE — Progress Notes (Signed)
US for growth/BPP scheduled on 3/6 

## 2017-08-02 NOTE — Progress Notes (Signed)
Prenatal Visit Note Date: 08/02/2017 Clinic: [facility]  Subjective:  [name] is a 27 y.o. G2P1001 at 5831w3d being seen today for ongoing prenatal care.  She is currently monitored for the following issues for this high-risk pregnancy and has Supervision of high-risk pregnancy; Cervical dysplasia; IUD pregnancy; Anemia; and GDM, class A2 on their problem list.  Patient reports no complaints.   Contractions: Irregular. Vag. Bleeding: None.  Movement: Present. Denies leaking of fluid.   The following portions of the patient's history were reviewed and updated as appropriate: allergies, current medications, past family history, past medical history, past social history, past surgical history and problem list. Problem list updated.  Objective:   Vitals:   08/02/17 1326  BP: (!) 102/51  Pulse: (!) 105  Weight: 220 lb (99.8 kg)    Fetal Status: Fetal Heart Rate (bpm): NST   Movement: Present     General:  Alert, oriented and cooperative. Patient is in no acute distress.  Skin: Skin is warm and dry. No rash noted.   Cardiovascular: Normal heart rate noted  Respiratory: Normal respiratory effort, no problems with respiration noted  Abdomen: Soft, gravid, appropriate for gestational age. Pain/Pressure: Present     Pelvic:  Cervical exam deferred        Extremities: Normal range of motion.     Mental Status: Normal mood and affect. Normal behavior. Normal judgment and thought content.   Urinalysis:      Assessment and Plan:  Pregnancy: G2P1001 at 9631w3d  1. Supervision of high risk pregnancy in third trimester Routine care. D/w pt more re: BC nv  2. GDM, class A2 Doing well on glyburide 5 qhs. BPP 10/10 today. Rpt growth next week  Preterm labor symptoms and general obstetric precautions including but not limited to vaginal bleeding, contractions, leaking of fluid and fetal movement were reviewed in detail with the patient. Please refer to After Visit  Summary for other counseling recommendations.  Return in about 7 days (around 08/09/2017) for Weekly as scheduled.   [place] BingPickens, Corrissa Martello, MD

## 2017-08-03 NOTE — Progress Notes (Signed)
I have reviewed the chart and agree with nursing staff's documentation of this patient's encounter.  Mansur Patti, MD 08/03/2017 5:23 PM    

## 2017-08-05 ENCOUNTER — Encounter (HOSPITAL_COMMUNITY): Payer: Self-pay | Admitting: *Deleted

## 2017-08-05 ENCOUNTER — Other Ambulatory Visit: Payer: Self-pay

## 2017-08-05 ENCOUNTER — Inpatient Hospital Stay (HOSPITAL_COMMUNITY)
Admission: AD | Admit: 2017-08-05 | Discharge: 2017-08-05 | Disposition: A | Discharge status: Home / Self Care | Payer: Medicaid Other | Source: Ambulatory Visit | Attending: Obstetrics and Gynecology | Admitting: Obstetrics and Gynecology

## 2017-08-05 DIAGNOSIS — A084 Viral intestinal infection, unspecified: Secondary | ICD-10-CM

## 2017-08-05 DIAGNOSIS — Z3A35 35 weeks gestation of pregnancy: Secondary | ICD-10-CM | POA: Diagnosis not present

## 2017-08-05 DIAGNOSIS — O26893 Other specified pregnancy related conditions, third trimester: Secondary | ICD-10-CM | POA: Diagnosis not present

## 2017-08-05 DIAGNOSIS — O9989 Other specified diseases and conditions complicating pregnancy, childbirth and the puerperium: Secondary | ICD-10-CM

## 2017-08-05 DIAGNOSIS — M549 Dorsalgia, unspecified: Secondary | ICD-10-CM

## 2017-08-05 DIAGNOSIS — R197 Diarrhea, unspecified: Secondary | ICD-10-CM | POA: Diagnosis not present

## 2017-08-05 DIAGNOSIS — O99891 Other specified diseases and conditions complicating pregnancy: Secondary | ICD-10-CM

## 2017-08-05 DIAGNOSIS — R109 Unspecified abdominal pain: Secondary | ICD-10-CM | POA: Diagnosis not present

## 2017-08-05 DIAGNOSIS — O212 Late vomiting of pregnancy: Secondary | ICD-10-CM | POA: Diagnosis not present

## 2017-08-05 DIAGNOSIS — R112 Nausea with vomiting, unspecified: Secondary | ICD-10-CM

## 2017-08-05 LAB — URINALYSIS, ROUTINE W REFLEX MICROSCOPIC
BILIRUBIN URINE: NEGATIVE
Glucose, UA: NEGATIVE mg/dL
HGB URINE DIPSTICK: NEGATIVE
Ketones, ur: 20 mg/dL — AB
LEUKOCYTES UA: NEGATIVE
NITRITE: NEGATIVE
PH: 6 (ref 5.0–8.0)
Protein, ur: 30 mg/dL — AB
SPECIFIC GRAVITY, URINE: 1.024 (ref 1.005–1.030)

## 2017-08-05 MED ORDER — ONDANSETRON 8 MG PO TBDP
8.0000 mg | ORAL_TABLET | Freq: Once | ORAL | Status: AC
  Administered 2017-08-05: 8 mg via ORAL
  Filled 2017-08-05: qty 1

## 2017-08-05 MED ORDER — ONDANSETRON 4 MG PO TBDP: 4.0000 mg | ORAL_TABLET | Freq: Three times a day (TID) | ORAL | 0 refills | Status: DC | PRN

## 2017-08-05 MED ORDER — LACTATED RINGERS IV BOLUS (SEPSIS)
1000.0000 mL | Freq: Once | INTRAVENOUS | Status: AC
  Administered 2017-08-05: 1000 mL via INTRAVENOUS

## 2017-08-05 NOTE — MAU Note (Signed)
Have back pain and abd pain, started at 3, constant- not going away.  Has had diarrhea, started at 9, has gone 5 times; started as loose, now watery. No vomiting or fever.pain is like contractions, said constant as they have gotten closer

## 2017-08-05 NOTE — MAU Provider Note (Signed)
History   G2P1001 @ 35.6 wks in with nausea, diarrhea, back pain and abd pain since 03:00 this morning. Denies ROM or vag bleeding.  CSN: [id number]  Arrival date & time 08/05/17  1220   None     Chief Complaint  Patient presents with  . Abdominal Pain  . Back Pain  . Nausea  . Diarrhea    HPI  Past Medical History:  Diagnosis Date  . Intrauterine device (IUD) contraceptive failure resulting in pregnancy 01/2017   paragard removed at 5wks. pregnancy continued normally  . Trichomoniasis of vagina 04/05/2017   Needs TOC end of November    No past surgical history on file.  Family History  Problem Relation Age of Onset  . Diabetes Mother     Social History   Tobacco Use  . Smoking status: Never Smoker  . Smokeless tobacco: Never Used  Substance Use Topics  . Alcohol use: No  . Drug use: No    OB History    Gravida Para Term Preterm AB Living   2 1 1  0 0 1   SAB TAB Ectopic Multiple Live Births   0 0 0 0 0      Review of Systems  Constitutional: Negative.   HENT: Negative.   Eyes: Negative.   Respiratory: Negative.   Cardiovascular: Negative.   Gastrointestinal: Positive for abdominal pain, diarrhea and nausea.  Endocrine: Negative.   Genitourinary: Negative.   Musculoskeletal: Positive for back pain.  Skin: Negative.   Allergic/Immunologic: Negative.   Neurological: Negative.   Hematological: Negative.   Psychiatric/Behavioral: Negative.     Allergies  Patient has no known allergies.  Home Medications    BP (!) 113/58 (BP Location: Right Arm)   Pulse (!) 106   Temp 98 F (36.7 C) (Oral)   Resp 17   Wt 219 lb 4 oz (99.5 kg)   LMP 11/20/2016 (Exact Date)   SpO2 98%   BMI 40.10 kg/m   Physical Exam  Constitutional: She is oriented to person, place, and time. She appears well-developed and well-nourished.  HENT:  Head: Normocephalic.  Eyes: Pupils are equal, round, and reactive to light.  Neck: Normal range of motion.  Cardiovascular:  Normal rate, regular rhythm, normal heart sounds and intact distal pulses.  Pulmonary/Chest: Effort normal and breath sounds normal.  Abdominal: Soft. Bowel sounds are normal.  Genitourinary: Vagina normal and uterus normal.  Musculoskeletal: Normal range of motion.  Neurological: She is alert and oriented to person, place, and time. She has normal reflexes.  Skin: Skin is warm and dry.  Psychiatric: She has a normal mood and affect. Her behavior is normal. Judgment and thought content normal.    MAU Course  Procedures (including critical care time)  Labs Reviewed  URINALYSIS, ROUTINE W REFLEX MICROSCOPIC   No results found.   1. Nausea vomiting and diarrhea   2. Abdominal pain in pregnancy, third trimester   3. Back pain affecting pregnancy in third trimester       MDM  VSS, FHR pattern reassuring, accels noted no decels. Mild uc's 2-3. U/a shoes ketones. Will IV hydrate. SVE 1-2/th/-3. rwetainning PO fluids. Will d/c home

## 2017-08-05 NOTE — Discharge Instructions (Signed)
Viral Gastroenteritis, Adult Viral gastroenteritis is also known as the stomach flu. This condition is caused by certain germs (viruses). These germs can be passed from person to person very easily (are very contagious). This condition can cause sudden watery poop (diarrhea), fever, and throwing up (vomiting). Having watery poop and throwing up can make you feel weak and cause you to get dehydrated. Dehydration can make you tired and thirsty, make you have a dry mouth, and make it so you pee (urinate) less often. Older adults and people with other diseases or a weak defense system (immune system) are at higher risk for dehydration. It is important to replace the fluids that you lose from having watery poop and throwing up. Follow these instructions at home: Follow instructions from your doctor about how to care for yourself at home. Eating and drinking  Follow these instructions as told by your doctor:  Take an oral rehydration solution (ORS). This is a drink that is sold at pharmacies and stores.  Drink clear fluids in small amounts as you are able, such as: ? Water. ? Ice chips. ? Diluted fruit juice. ? Low-calorie sports drinks.  Eat bland, easy-to-digest foods in small amounts as you are able, such as: ? Bananas. ? Applesauce. ? Rice. ? Low-fat (lean) meats. ? Toast. ? Crackers.  Avoid fluids that have a lot of sugar or caffeine in them.  Avoid alcohol.  Avoid spicy or fatty foods.  General instructions  Drink enough fluid to keep your pee (urine) clear or pale yellow.  Wash your hands often. If you cannot use soap and water, use hand sanitizer.  Make sure that all people in your home wash their hands well and often.  Rest at home while you get better.  Take over-the-counter and prescription medicines only as told by your doctor.  Watch your condition for any changes.  Take a warm bath to help with any burning or pain from having watery poop.  Keep all follow-up  visits as told by your doctor. This is important. Contact a doctor if:  You cannot keep fluids down.  Your symptoms get worse.  You have new symptoms.  You feel light-headed or dizzy.  You have muscle cramps. Get help right away if:  You have chest pain.  You feel very weak or you pass out (faint).  You see blood in your throw-up.  Your throw-up looks like coffee grounds.  You have bloody or black poop (stools) or poop that look like tar.  You have a very bad headache, a stiff neck, or both.  You have a rash.  You have very bad pain, cramping, or bloating in your belly (abdomen).  You have trouble breathing.  You are breathing very quickly.  Your heart is beating very quickly.  Your skin feels cold and clammy.  You feel confused.  You have pain when you pee.  You have signs of dehydration, such as: ? Dark pee, hardly any pee, or no pee. ? Cracked lips. ? Dry mouth. ? Sunken eyes. ? Sleepiness. ? Weakness. This information is not intended to replace advice given to you by your health care provider. Make sure you discuss any questions you have with your health care provider. Document Released: 11/09/2007 Document Revised: 12/11/2015 Document Reviewed: 01/27/2015 Elsevier Interactive Patient Education  2017 Else[employer] Inc[employer]      Back Pain in Pregnancy Back pain during pregnancy is common. Back pain may be caused by several factors that are related to changes during  your pregnancy. Follow these instructions at home: Managing pain, stiffness, and swelling  If directed, apply ice for sudden (acute) back pain. ? Put ice in a plastic bag. ? Place a towel between your skin and the bag. ? Leave the ice on for 20 minutes, 2-3 times per day.  If directed, apply heat to the affected area before you exercise: ? Place a towel between your skin and the heat pack or heating pad. ? Leave the heat on for 20-30 minutes. ? Remove the heat if your skin turns bright red.  This is especially important if you are unable to feel pain, heat, or cold. You may have a greater risk of getting burned. Activity  Exercise as told by your health care provider. Exercising is the best way to prevent or manage back pain.  Listen to your body when lifting. If lifting hurts, ask for help or bend your knees. This uses your leg muscles instead of your back muscles.  Squat down when picking up something from the floor. Do not bend over.  Only use bed rest as told by your health care provider. Bed rest should only be used for the most severe episodes of back pain. Standing, Sitting, and Lying Down  Do not stand in one place for long periods of time.  Use good posture when sitting. Make sure your head rests over your shoulders and is not hanging forward. Use a pillow on your lower back if necessary.  Try sleeping on your side, preferably the left side, with a pillow or two between your legs. If you are sore after a night's rest, your bed may be too soft. A firm mattress may provide more support for your back during pregnancy. General instructions  Do not wear high heels.  Eat a healthy diet. Try to gain weight within your health care provider's recommendations.  Use a maternity girdle, elastic sling, or back brace as told by your health care provider.  Take over-the-counter and prescription medicines only as told by your health care provider.  Keep all follow-up visits as told by your health care provider. This is important. This includes any visits with any specialists, such as a physical therapist. Contact a health care provider if:  Your back pain interferes with your daily activities.  You have increasing pain in other parts of your body. Get help right away if:  You develop numbness, tingling, weakness, or problems with the use of your arms or legs.  You develop severe back pain that is not controlled with medicine.  You have a sudden change in bowel or bladder  control.  You develop shortness of breath, dizziness, or you faint.  You develop nausea, vomiting, or sweating.  You have back pain that is a rhythmic, cramping pain similar to labor pains. Labor pain is usually 1-2 minutes apart, lasts for about 1 minute, and involves a bearing down feeling or pressure in your pelvis.  You have back pain and your water breaks or you have vaginal bleeding.  You have back pain or numbness that travels down your leg.  Your back pain developed after you fell.  You develop pain on one side of your back.  You see blood in your urine.  You develop skin blisters in the area of your back pain. This information is not intended to replace advice given to you by your health care provider. Make sure you discuss any questions you have with your health care provider. Document Released: 08/31/2005 Document Revised:  10/29/2015 Document Reviewed: 02/04/2015 Elsevier Interactive Patient Education  [employer]2018 Else[employer] Inc.

## 2017-08-09 ENCOUNTER — Encounter (HOSPITAL_COMMUNITY): Payer: Self-pay

## 2017-08-09 ENCOUNTER — Encounter: Payer: Self-pay | Admitting: Obstetrics and Gynecology

## 2017-08-09 ENCOUNTER — Encounter: Payer: Medicaid Other | Admitting: Obstetrics and Gynecology

## 2017-08-09 ENCOUNTER — Ambulatory Visit (INDEPENDENT_AMBULATORY_CARE_PROVIDER_SITE_OTHER): Payer: Medicaid Other | Admitting: Obstetrics and Gynecology

## 2017-08-09 ENCOUNTER — Other Ambulatory Visit (HOSPITAL_COMMUNITY)
Admission: RE | Admit: 2017-08-09 | Discharge: 2017-08-09 | Disposition: A | Discharge status: Home / Self Care | Payer: Medicaid Other | Source: Ambulatory Visit | Attending: Obstetrics and Gynecology | Admitting: Obstetrics and Gynecology

## 2017-08-09 ENCOUNTER — Ambulatory Visit (HOSPITAL_COMMUNITY)
Admission: RE | Admit: 2017-08-09 | Discharge: 2017-08-09 | Disposition: A | Discharge status: Home / Self Care | Payer: Medicaid Other | Source: Ambulatory Visit | Attending: Obstetrics and Gynecology | Admitting: Obstetrics and Gynecology

## 2017-08-09 ENCOUNTER — Other Ambulatory Visit (HOSPITAL_COMMUNITY): Payer: Self-pay | Admitting: Obstetrics and Gynecology

## 2017-08-09 ENCOUNTER — Ambulatory Visit (INDEPENDENT_AMBULATORY_CARE_PROVIDER_SITE_OTHER): Payer: Medicaid Other | Admitting: General Practice

## 2017-08-09 VITALS — BP 116/58 | HR 99 | Wt 219.0 lb

## 2017-08-09 DIAGNOSIS — Z362 Encounter for other antenatal screening follow-up: Secondary | ICD-10-CM | POA: Diagnosis not present

## 2017-08-09 DIAGNOSIS — O24419 Gestational diabetes mellitus in pregnancy, unspecified control: Secondary | ICD-10-CM

## 2017-08-09 DIAGNOSIS — O0993 Supervision of high risk pregnancy, unspecified, third trimester: Secondary | ICD-10-CM

## 2017-08-09 DIAGNOSIS — O24415 Gestational diabetes mellitus in pregnancy, controlled by oral hypoglycemic drugs: Secondary | ICD-10-CM

## 2017-08-09 DIAGNOSIS — O99213 Obesity complicating pregnancy, third trimester: Secondary | ICD-10-CM | POA: Diagnosis not present

## 2017-08-09 DIAGNOSIS — Z3A36 36 weeks gestation of pregnancy: Secondary | ICD-10-CM | POA: Insufficient documentation

## 2017-08-09 DIAGNOSIS — N879 Dysplasia of cervix uteri, unspecified: Secondary | ICD-10-CM

## 2017-08-09 LAB — POCT URINALYSIS DIP (DEVICE)
Bilirubin Urine: NEGATIVE
GLUCOSE, UA: 100 mg/dL — AB
Leukocytes, UA: NEGATIVE
NITRITE: NEGATIVE
PROTEIN: 30 mg/dL — AB
UROBILINOGEN UA: 1 mg/dL (ref 0.0–1.0)
pH: 6 (ref 5.0–8.0)

## 2017-08-09 LAB — OB RESULTS CONSOLE GBS: STREP GROUP B AG: NEGATIVE

## 2017-08-09 NOTE — Progress Notes (Signed)
US for growth and BPP done today 

## 2017-08-09 NOTE — Progress Notes (Signed)
   PRENATAL VISIT NOTE  Subjective:  Brittany Kerr is a 27 y.o. G2P1001 at 2369w3d being seen today for ongoing prenatal care.  She is currently monitored for the following issues for this high-risk pregnancy and has Supervision of high-risk pregnancy; Cervical dysplasia; IUD pregnancy; Anemia; and GDM, class A2 on their problem list.  Patient reports no complaints.  Contractions: Irregular. Vag. Bleeding: None.  Movement: Present. Denies leaking of fluid.   The following portions of the patient's history were reviewed and updated as appropriate: allergies, current medications, past family history, past medical history, past social history, past surgical history and problem list. Problem list updated.  Objective:   Vitals:   08/09/17 1442  BP: (!) 116/58  Pulse: 99  Weight: 219 lb (99.3 kg)    Fetal Status: Fetal Heart Rate (bpm): NST   Movement: Present  Presentation: Vertex  General:  Alert, oriented and cooperative. Patient is in no acute distress.  Skin: Skin is warm and dry. No rash noted.   Cardiovascular: Normal heart rate noted  Respiratory: Normal respiratory effort, no problems with respiration noted  Abdomen: Soft, gravid, appropriate for gestational age.  Pain/Pressure: Present     Pelvic: Cervical exam performed Dilation: Fingertip Effacement (%): Thick Station: Ballotable  Extremities: Normal range of motion.  Edema: None  Mental Status:  Normal mood and affect. Normal behavior. Normal judgment and thought content.   Assessment and Plan:  Pregnancy: G2P1001 at 6969w3d  1. Supervision of high risk pregnancy in third trimester Patient is doing well without complaints Cultures collected today - Culture, beta strep (group b only) - GC/Chlamydia probe amp ([place]  2. GDM, class A2 CBGs reviewed and all within range Continue glyburide 5 mg qHS 3/6 EFW 3174 mg BPP 10/10 (reactive NST in office today) Continue antenatal testing Plan for delivery at  39 weeks - Culture, beta strep (group b only) - GC[facility]Chlamydia probe amp ([place]  Preterm labor symptoms and general obstetric precautions including but not limited to vaginal bleeding, contractions, leaking of fluid and fetal movement were reviewed in detail with the patient. Please refer to After Visit Summary for other counseling recommendations.  Return in about 1 week (around 08/16/2017) for ROB, NST/BPP.   Catalina AntiguaPeggy Maigen Mozingo, MD

## 2017-08-10 LAB — GC/CHLAMYDIA PROBE AMP (~~LOC~~) NOT AT ARMC
Chlamydia: NEGATIVE
NEISSERIA GONORRHEA: NEGATIVE

## 2017-08-13 LAB — CULTURE, BETA STREP (GROUP B ONLY): Strep Gp B Culture: NEGATIVE

## 2017-08-16 ENCOUNTER — Ambulatory Visit (INDEPENDENT_AMBULATORY_CARE_PROVIDER_SITE_OTHER): Payer: Medicaid Other | Admitting: *Deleted

## 2017-08-16 ENCOUNTER — Ambulatory Visit: Payer: Self-pay

## 2017-08-16 ENCOUNTER — Ambulatory Visit (INDEPENDENT_AMBULATORY_CARE_PROVIDER_SITE_OTHER): Payer: Medicaid Other | Admitting: Obstetrics and Gynecology

## 2017-08-16 ENCOUNTER — Encounter: Payer: Self-pay | Admitting: Obstetrics and Gynecology

## 2017-08-16 ENCOUNTER — Encounter: Payer: Medicaid Other | Admitting: Obstetrics and Gynecology

## 2017-08-16 VITALS — BP 94/55 | HR 99 | Wt 219.6 lb

## 2017-08-16 DIAGNOSIS — O0993 Supervision of high risk pregnancy, unspecified, third trimester: Secondary | ICD-10-CM

## 2017-08-16 DIAGNOSIS — O24419 Gestational diabetes mellitus in pregnancy, unspecified control: Secondary | ICD-10-CM

## 2017-08-16 NOTE — Progress Notes (Signed)
   PRENATAL VISIT NOTE  Subjective:  Brittany Kerr is a 27 y.o. G2P1001 at 6787w3d being seen today for ongoing prenatal care.  She is currently monitored for the following issues for this high-risk pregnancy and has Supervision of high-risk pregnancy; Cervical dysplasia; IUD pregnancy; Anemia; and GDM, class A2 on their problem list.  Patient reports no complaints.  Contractions: Irregular. Vag. Bleeding: None.  Movement: Present. Denies leaking of fluid.   The following portions of the patient's history were reviewed and updated as appropriate: allergies, current medications, past family history, past medical history, past social history, past surgical history and problem list. Problem list updated.  Objective:   Vitals:   08/16/17 1413  BP: (!) 94/55  Pulse: 99  Weight: 219 lb 9.6 oz (99.6 kg)    Fetal Status: Fetal Heart Rate (bpm): NST   Movement: Present     General:  Alert, oriented and cooperative. Patient is in no acute distress.  Skin: Skin is warm and dry. No rash noted.   Cardiovascular: Normal heart rate noted  Respiratory: Normal respiratory effort, no problems with respiration noted  Abdomen: Soft, gravid, appropriate for gestational age.  Pain/Pressure: Present     Pelvic: Cervical exam deferred        Extremities: Normal range of motion.  Edema: None  Mental Status:  Normal mood and affect. Normal behavior. Normal judgment and thought content.   Assessment and Plan:  Pregnancy: G2P1001 at 487w3d  1. Supervision of high risk pregnancy in third trimester Patient is doing well without complaints  2. GDM, class A2 CBGs reviewed and majority within range, 2 elevated post dinner values (127, 130) Continue glyburide 5 mg qHS Will schedule IOL at 39 weeks (orders placed) NST reviewed and reactive with baseline 145, mod variability, + accels, no decels  Term labor symptoms and general obstetric precautions including but not limited to vaginal bleeding, contractions,  leaking of fluid and fetal movement were reviewed in detail with the patient. Please refer to After Visit Summary for other counseling recommendations.  Return in about 1 week (around 08/23/2017) for ROB, NST, BPP .   Catalina AntiguaPeggy Matthew Cina, MD

## 2017-08-16 NOTE — Progress Notes (Signed)
Pt informed that the ultrasound is considered a limited OB ultrasound and is not intended to be a complete ultrasound exam.  Patient also informed that the ultrasound is not being completed with the intent of assessing for fetal or placental anomalies or any pelvic abnormalities.  Explained that the purpose of today's ultrasound is to assess for  BPP, presentation and AFI.  Patient acknowledges the purpose of the exam and the limitations of the study.    

## 2017-08-17 ENCOUNTER — Telehealth (HOSPITAL_COMMUNITY): Payer: Self-pay | Admitting: *Deleted

## 2017-08-17 NOTE — Telephone Encounter (Signed)
Preadmission screen  

## 2017-08-23 ENCOUNTER — Ambulatory Visit: Payer: Self-pay

## 2017-08-23 ENCOUNTER — Encounter: Payer: Medicaid Other | Admitting: Family Medicine

## 2017-08-23 ENCOUNTER — Ambulatory Visit (INDEPENDENT_AMBULATORY_CARE_PROVIDER_SITE_OTHER): Payer: Medicaid Other | Admitting: General Practice

## 2017-08-23 ENCOUNTER — Ambulatory Visit (INDEPENDENT_AMBULATORY_CARE_PROVIDER_SITE_OTHER): Payer: Medicaid Other | Admitting: Obstetrics & Gynecology

## 2017-08-23 VITALS — BP 122/55 | HR 84 | Wt 223.2 lb

## 2017-08-23 DIAGNOSIS — O24419 Gestational diabetes mellitus in pregnancy, unspecified control: Secondary | ICD-10-CM

## 2017-08-23 DIAGNOSIS — O0993 Supervision of high risk pregnancy, unspecified, third trimester: Secondary | ICD-10-CM

## 2017-08-23 NOTE — Progress Notes (Signed)
Pt informed that the ultrasound is considered a limited OB ultrasound and is not intended to be a complete ultrasound exam.  Patient also informed that the ultrasound is not being completed with the intent of assessing for fetal or placental anomalies or any pelvic abnormalities.  Explained that the purpose of today's ultrasound is to assess for  BPP, presentation and AFI.  Patient acknowledges the purpose of the exam and the limitations of the study.    

## 2017-08-23 NOTE — Progress Notes (Signed)
   PRENATAL VISIT NOTE  Subjective:  Brittany Kerr is a 27 y.o. G2P1001 at 5328w3d being seen today for ongoing prenatal care.  She is currently monitored for the following issues for this high-risk pregnancy and has Supervision of high-risk pregnancy; Cervical dysplasia; IUD pregnancy; Anemia; and GDM, class A2 on their problem list.  Patient reports no complaints.  Contractions: Irregular. Vag. Bleeding: None.  Movement: Present. Denies leaking of fluid.   The following portions of the patient's history were reviewed and updated as appropriate: allergies, current medications, past family history, past medical history, past social history, past surgical history and problem list. Problem list updated.  Objective:   Vitals:   08/23/17 1433  BP: (!) 122/55  Pulse: 84  Weight: 223 lb 3.2 oz (101.2 kg)    Fetal Status: Fetal Heart Rate (bpm): 143   Movement: Present     General:  Alert, oriented and cooperative. Patient is in no acute distress.  Skin: Skin is warm and dry. No rash noted.   Cardiovascular: Normal heart rate noted  Respiratory: Normal respiratory effort, no problems with respiration noted  Abdomen: Soft, gravid, appropriate for gestational age.  Pain/Pressure: Present     Pelvic: Cervical exam deferred        Extremities: Normal range of motion.  Edema: Trace  Mental Status:  Normal mood and affect. Normal behavior. Normal judgment and thought content.   Assessment and Plan:  Pregnancy: G2P1001 at 4728w3d  1. GDM, class A2 FBS and PP 80% in range  2. Supervision of high risk pregnancy in third trimester IOL 39 weeks  Term labor symptoms and general obstetric precautions including but not limited to vaginal bleeding, contractions, leaking of fluid and fetal movement were reviewed in detail with the patient. Please refer to After Visit Summary for other counseling recommendations.  Return if symptoms worsen or fail to improve, for postpartum.   Scheryl DarterJames Colleen Kotlarz, MD

## 2017-08-23 NOTE — Patient Instructions (Signed)
Labor Induction Labor induction is when steps are taken to cause a pregnant woman to begin the labor process. Most women go into labor on their own between 37 weeks and 42 weeks of the pregnancy. When this does not happen or when there is a medical need, methods may be used to induce labor. Labor induction causes a pregnant woman's uterus to contract. It also causes the cervix to soften (ripen), open (dilate), and thin out (efface). Usually, labor is not induced before 39 weeks of the pregnancy unless there is a problem with the baby or mother. Before inducing labor, your health care provider will consider a number of factors, including the following:  The medical condition of you and the baby.  How many weeks along you are.  The status of the baby's lung maturity.  The condition of the cervix.  The position of the baby. What are the reasons for labor induction? Labor may be induced for the following reasons:  The health of the baby or mother is at risk.  The pregnancy is overdue by 1 week or more.  The water breaks but labor does not start on its own.  The mother has a health condition or serious illness, such as high blood pressure, infection, placental abruption, or diabetes.  The amniotic fluid amounts are low around the baby.  The baby is distressed. Convenience or wanting the baby to be born on a certain date is not a reason for inducing labor. What methods are used for labor induction? Several methods of labor induction may be used, such as:  Prostaglandin medicine. This medicine causes the cervix to dilate and ripen. The medicine will also start contractions. It can be taken by mouth or by inserting a suppository into the vagina.  Inserting a thin tube (catheter) with a balloon on the end into the vagina to dilate the cervix. Once inserted, the balloon is expanded with water, which causes the cervix to open.  Stripping the membranes. Your health care provider separates  amniotic sac tissue from the cervix, causing the cervix to be stretched and causing the release of a hormone called progesterone. This may cause the uterus to contract. It is often done during an office visit. You will be sent home to wait for the contractions to begin. You will then come in for an induction.  Breaking the water. Your health care provider makes a hole in the amniotic sac using a small instrument. Once the amniotic sac breaks, contractions should begin. This may still take hours to see an effect.  Medicine to trigger or strengthen contractions. This medicine is given through an IV access tube inserted into a vein in your arm. All of the methods of induction, besides stripping the membranes, will be done in the hospital. Induction is done in the hospital so that you and the baby can be carefully monitored. How long does it take for labor to be induced? Some inductions can take up to 2-3 days. Depending on the cervix, it usually takes less time. It takes longer when you are induced early in the pregnancy or if this is your first pregnancy. If a mother is still pregnant and the induction has been going on for 2-3 days, either the mother will be sent home or a cesarean delivery will be needed. What are the risks associated with labor induction? Some of the risks of induction include:  Changes in fetal heart rate, such as too high, too low, or erratic.  Fetal distress.    Chance of infection for the mother and baby.  Increased chance of having a cesarean delivery.  Breaking off (abruption) of the placenta from the uterus (rare).  Uterine rupture (very rare). When induction is needed for medical reasons, the benefits of induction may outweigh the risks. What are some reasons for not inducing labor? Labor induction should not be done if:  It is shown that your baby does not tolerate labor.  You have had previous surgeries on your uterus, such as a myomectomy or the removal of  fibroids.  Your placenta lies very low in the uterus and blocks the opening of the cervix (placenta previa).  Your baby is not in a head-down position.  The umbilical cord drops down into the birth canal in front of the baby. This could cut off the baby's blood and oxygen supply.  You have had a previous cesarean delivery.  There are unusual circumstances, such as the baby being extremely premature. This information is not intended to replace advice given to you by your health care provider. Make sure you discuss any questions you have with your health care provider. Document Released: 10/12/2006 Document Revised: 10/29/2015 Document Reviewed: 12/20/2012 Elsevier Interactive Patient Education  2017 [employer]  

## 2017-08-27 ENCOUNTER — Encounter (HOSPITAL_COMMUNITY): Payer: Self-pay

## 2017-08-27 ENCOUNTER — Inpatient Hospital Stay (HOSPITAL_COMMUNITY): Payer: Medicaid Other | Admitting: Anesthesiology

## 2017-08-27 ENCOUNTER — Inpatient Hospital Stay (HOSPITAL_COMMUNITY)
Admission: RE | Admit: 2017-08-27 | Discharge: 2017-08-29 | DRG: 807 | Disposition: A | Discharge status: Home / Self Care | Payer: Medicaid Other | Source: Ambulatory Visit | Attending: Obstetrics and Gynecology | Admitting: Obstetrics and Gynecology

## 2017-08-27 DIAGNOSIS — N879 Dysplasia of cervix uteri, unspecified: Secondary | ICD-10-CM

## 2017-08-27 DIAGNOSIS — O0990 Supervision of high risk pregnancy, unspecified, unspecified trimester: Secondary | ICD-10-CM

## 2017-08-27 DIAGNOSIS — O24425 Gestational diabetes mellitus in childbirth, controlled by oral hypoglycemic drugs: Secondary | ICD-10-CM | POA: Diagnosis present

## 2017-08-27 DIAGNOSIS — O99214 Obesity complicating childbirth: Secondary | ICD-10-CM | POA: Diagnosis present

## 2017-08-27 DIAGNOSIS — Z87410 Personal history of cervical dysplasia: Secondary | ICD-10-CM

## 2017-08-27 DIAGNOSIS — O24415 Gestational diabetes mellitus in pregnancy, controlled by oral hypoglycemic drugs: Secondary | ICD-10-CM | POA: Diagnosis present

## 2017-08-27 DIAGNOSIS — D649 Anemia, unspecified: Secondary | ICD-10-CM | POA: Diagnosis present

## 2017-08-27 DIAGNOSIS — O9902 Anemia complicating childbirth: Secondary | ICD-10-CM | POA: Diagnosis present

## 2017-08-27 DIAGNOSIS — Z3A39 39 weeks gestation of pregnancy: Secondary | ICD-10-CM | POA: Diagnosis not present

## 2017-08-27 DIAGNOSIS — O0993 Supervision of high risk pregnancy, unspecified, third trimester: Secondary | ICD-10-CM

## 2017-08-27 LAB — CBC
HEMATOCRIT: 33 % — AB (ref 36.0–46.0)
HEMOGLOBIN: 10.1 g/dL — AB (ref 12.0–15.0)
MCH: 21.8 pg — AB (ref 26.0–34.0)
MCHC: 30.6 g/dL (ref 30.0–36.0)
MCV: 71.1 fL — AB (ref 78.0–100.0)
Platelets: 269 10*3/uL (ref 150–400)
RBC: 4.64 MIL/uL (ref 3.87–5.11)
RDW: 17.9 % — ABNORMAL HIGH (ref 11.5–15.5)
WBC: 10.1 10*3/uL (ref 4.0–10.5)

## 2017-08-27 LAB — TYPE AND SCREEN
ABO/RH(D): A POS
Antibody Screen: NEGATIVE

## 2017-08-27 LAB — GLUCOSE, CAPILLARY
GLUCOSE-CAPILLARY: 134 mg/dL — AB (ref 65–99)
GLUCOSE-CAPILLARY: 66 mg/dL (ref 65–99)
GLUCOSE-CAPILLARY: 102 mg/dL — AB (ref 65–99)
GLUCOSE-CAPILLARY: 65 mg/dL (ref 65–99)

## 2017-08-27 MED ORDER — MISOPROSTOL 25 MCG QUARTER TABLET
25.0000 ug | ORAL_TABLET | ORAL | Status: DC | PRN
  Administered 2017-08-27 (×2): 25 ug via VAGINAL
  Filled 2017-08-27 (×2): qty 1

## 2017-08-27 MED ORDER — OXYCODONE-ACETAMINOPHEN 5-325 MG PO TABS: 2.0000 | ORAL_TABLET | ORAL | Status: DC | PRN

## 2017-08-27 MED ORDER — LIDOCAINE HCL (PF) 1 % IJ SOLN
30.0000 mL | INTRAMUSCULAR | Status: DC | PRN
  Administered 2017-08-27: 30 mL via SUBCUTANEOUS
  Filled 2017-08-27: qty 30

## 2017-08-27 MED ORDER — FENTANYL 2.5 MCG/ML BUPIVACAINE 1/10 % EPIDURAL INFUSION (WH - ANES)
14.0000 mL/h | INTRAMUSCULAR | Status: DC | PRN
  Administered 2017-08-27: 14 mL/h via EPIDURAL
  Filled 2017-08-27: qty 100

## 2017-08-27 MED ORDER — LACTATED RINGERS IV SOLN: 500.0000 mL | Freq: Once | INTRAVENOUS | Status: DC

## 2017-08-27 MED ORDER — LACTATED RINGERS IV SOLN
INTRAVENOUS | Status: DC
  Administered 2017-08-27 (×3): via INTRAVENOUS

## 2017-08-27 MED ORDER — DIPHENHYDRAMINE HCL 50 MG/ML IJ SOLN: 12.5000 mg | INTRAMUSCULAR | Status: DC | PRN

## 2017-08-27 MED ORDER — FENTANYL 2.5 MCG/ML BUPIVACAINE 1/10 % EPIDURAL INFUSION (WH - ANES): 14.0000 mL/h | INTRAMUSCULAR | Status: DC | PRN

## 2017-08-27 MED ORDER — PHENYLEPHRINE 40 MCG/ML (10ML) SYRINGE FOR IV PUSH (FOR BLOOD PRESSURE SUPPORT)
80.0000 ug | PREFILLED_SYRINGE | INTRAVENOUS | Status: DC | PRN
  Filled 2017-08-27: qty 5
  Filled 2017-08-27: qty 10

## 2017-08-27 MED ORDER — LACTATED RINGERS IV SOLN
500.0000 mL | INTRAVENOUS | Status: DC | PRN
  Administered 2017-08-27: 300 mL via INTRAVENOUS

## 2017-08-27 MED ORDER — EPHEDRINE 5 MG/ML INJ
10.0000 mg | INTRAVENOUS | Status: DC | PRN
  Filled 2017-08-27: qty 2

## 2017-08-27 MED ORDER — OXYTOCIN 40 UNITS IN LACTATED RINGERS INFUSION - SIMPLE MED
1.0000 m[IU]/min | INTRAVENOUS | Status: DC
  Administered 2017-08-27: 2 m[IU]/min via INTRAVENOUS
  Filled 2017-08-27: qty 1000

## 2017-08-27 MED ORDER — SOD CITRATE-CITRIC ACID 500-334 MG/5ML PO SOLN
30.0000 mL | ORAL | Status: DC | PRN
  Administered 2017-08-27: 30 mL via ORAL
  Filled 2017-08-27: qty 15

## 2017-08-27 MED ORDER — LIDOCAINE HCL (PF) 1 % IJ SOLN
INTRAMUSCULAR | Status: DC | PRN
  Administered 2017-08-27 (×2): 4 mL

## 2017-08-27 MED ORDER — TERBUTALINE SULFATE 1 MG/ML IJ SOLN
0.2500 mg | Freq: Once | INTRAMUSCULAR | Status: DC | PRN
  Filled 2017-08-27: qty 1

## 2017-08-27 MED ORDER — ONDANSETRON HCL 4 MG/2ML IJ SOLN
4.0000 mg | Freq: Four times a day (QID) | INTRAMUSCULAR | Status: DC | PRN
  Administered 2017-08-27: 4 mg via INTRAVENOUS
  Filled 2017-08-27: qty 2

## 2017-08-27 MED ORDER — OXYCODONE-ACETAMINOPHEN 5-325 MG PO TABS
1.0000 | ORAL_TABLET | ORAL | Status: DC | PRN
Start: 2017-08-27 — End: 2017-08-28

## 2017-08-27 MED ORDER — PHENYLEPHRINE 40 MCG/ML (10ML) SYRINGE FOR IV PUSH (FOR BLOOD PRESSURE SUPPORT)
80.0000 ug | PREFILLED_SYRINGE | INTRAVENOUS | Status: DC | PRN
  Administered 2017-08-27: 80 ug via INTRAVENOUS
  Filled 2017-08-27: qty 5

## 2017-08-27 MED ORDER — OXYTOCIN BOLUS FROM INFUSION
500.0000 mL | Freq: Once | INTRAVENOUS | Status: AC
  Administered 2017-08-27: 500 mL via INTRAVENOUS

## 2017-08-27 MED ORDER — OXYTOCIN 40 UNITS IN LACTATED RINGERS INFUSION - SIMPLE MED: 2.5000 [IU]/h | INTRAVENOUS | Status: DC

## 2017-08-27 MED ORDER — ACETAMINOPHEN 325 MG PO TABS: 650.0000 mg | ORAL_TABLET | ORAL | Status: DC | PRN

## 2017-08-27 NOTE — Progress Notes (Signed)
Labor Progress Note Shelle IronJesica Sebastiano is a 27 y.o. G2P1001 at 2770w0d presented for IOL for A2GDM.  S: Request for AROM due to bulging bag.  O:  BP 111/60   Pulse 87   Temp (!) 97.5 F (36.4 C) (Oral)   Resp 16   Ht 5\' 1"  (1.549 m)   Wt 221 lb 3.2 oz (100.3 kg)   LMP 11/20/2016 (Exact Date)   BMI 41.80 kg/m   ZOX:[id number]:baseline rate 145, moderate variability, + acels, few variable decels Toco: ctx every 2-4 min  CVE: Dilation: 6.5 Effacement (%): 90 Cervical Position: Posterior Station: -1, -2 Presentation: Vertex Exam by:: Carney LivingHanna Beitz RN   A&P: 27 y.o. G2P1001 3970w0d here for IOL for A2GDM. Labor: Progressing well, making good change without augmentation currently. AROM @ 2104. Pain: Epidural FWB: Cat II, continue to monitor. GDM: CBGs wnl, continue to monitor.  Ellwood DenseAlison Melanni Benway, DO 9:07 PM

## 2017-08-27 NOTE — Anesthesia Procedure Notes (Signed)
Epidural Patient location during procedure: OB Start time: 08/27/2017 7:44 PM End time: 08/27/2017 7:54 PM  Staffing Anesthesiologist: Lewie LoronGermeroth, Nekayla Heider, MD Performed: anesthesiologist   Preanesthetic Checklist Completed: patient identified, pre-op evaluation, timeout performed, IV checked, risks and benefits discussed and monitors and equipment checked  Epidural Patient position: sitting Prep: site prepped and draped and DuraPrep Patient monitoring: heart rate Approach: midline Location: L3-L4 Injection technique: LOR air and LOR saline  Needle:  Needle type: Tuohy  Needle gauge: 17 G Needle length: 9 cm Needle insertion depth: 7 cm Catheter type: closed end flexible Catheter size: 19 Gauge Catheter at skin depth: 12 cm Test dose: negative  Assessment Sensory level: T8 Events: blood not aspirated, injection not painful, no injection resistance, negative IV test and no paresthesia  Additional Notes Reason for block:procedure for pain

## 2017-08-27 NOTE — H&P (Signed)
LABOR AND DELIVERY ADMISSION HISTORY AND PHYSICAL NOTE  Brittany Kerr is a 27 y.o. female G2P1001 with IUP at 3074w0d by 5-week U/S presenting for IOL d/t A2GDM. She reports positive fetal movement. She denies leakage of fluid or vaginal bleeding.  P[facility] History/Complications[facility] Pregnancy complications:  - GDM: on glyburide - IUD pregnancy (had paraguard; removed at [redacted] weeks GA) - Anemia  Past Medical History: Past Medical History:  Diagnosis Date  . Intrauterine device (IUD) contraceptive failure resulting in pregnancy 01/2017   paragard removed at 5wks. pregnancy continued normally  . Trichomoniasis of vagina 04/05/2017   Needs TOC end of November    Past Surgical History: History reviewed. No pertinent surgical history.  Obstetrical History: OB History    Gravida  2   Para  1   Term  1   Preterm  0   AB  0   Living  1     SAB  0   TAB  0   Ectopic  0   Multiple  0   Live Births  0           Social History: Social History   Socioeconomic History  . Marital status: Married    Spouse name: Not on file  . Number of children: Not on file  . Years of education: Not on file  . Highest education level: Not on file  Occupational History  . Not on file  Social Needs  . Financial resource strain: Not on file  . Food insecurity:    Worry: Not on file    Inability: Not on file  . Transportation needs:    Medical: Not on file    Non-medical: Not on file  Tobacco Use  . Smoking status: Never Smoker  . Smokeless tobacco: Never Used  Substance and Sexual Activity  . Alcohol use: No  . Drug use: No  . Sexual activity: Yes    Birth control/protection: None    Comment: last felt strings 2wks ago, placed in september  Lifestyle  . Physical activity:    Days per week: Not on file    Minutes per session: Not on file  . Stress: Not on file  Relationships  . Social connections:    Talks on phone: Not on file    Gets together: Not on file     Attends religious service: Not on file    Active member of club or organization: Not on file    Attends meetings of clubs or organizations: Not on file    Relationship status: Not on file  Other Topics Concern  . Not on file  Social History Narrative  . Not on file    Family History: Family History  Problem Relation Age of Onset  . Diabetes Mother     Allergies: No Known Allergies  Medications Prior to Admission  Medication Sig Dispense Refill Last Dose  . glyBURIDE (DIABETA) 2.5 MG tablet One and a half tabs po with dinner (Patient taking differently: Take 5 mg by mouth daily with supper. two tabs po with dinner) 60 tablet 4 08/26/2017 at Unknown time  . ondansetron (ZOFRAN ODT) 4 MG disintegrating tablet Take 1 tablet (4 mg total) by mouth every 8 (eight) hours as needed for nausea or vomiting. 20 tablet 0 08/26/2017 at Unknown time  . Prenatal Vit-Fe Fumarate-FA (PRENATAL MULTIVITAMIN) TABS tablet Take 1 tablet by mouth daily at 12 noon.   08/26/2017 at Unknown time  . ranitidine (ZANTAC) 150  MG tablet Take 1 tablet (150 mg total) by mouth 2 (two) times daily. 60 tablet 4 08/27/2017 at Unknown time  . ACCU-CHEK FASTCLIX LANCETS MISC 1 Device by Percutaneous route 4 (four) times daily. 100 each 12 Taking  . glucose blood (ACCU-CHEK GUIDE) test strip Use as instructed 4 times daily 100 each 12 Taking     Review of Systems  All systems reviewed and negative except as stated in HPI  Physical Exam Blood pressure 122/73, pulse (!) 107, temperature 98.1 F (36.7 C), temperature source Oral, resp. rate 18, height 5\' 1"  (1.549 m), weight 221 lb 3.2 oz (100.3 kg), last menstrual period 11/20/2016. General appearance: alert, oriented, NAD Lungs: normal respiratory effort Heart: regular rate Abdomen: soft, non-tender; gravid, FH appropriate for GA Extremities: No calf swelling or tenderness Presentation: cephalic by bedside U/S Fetal monitoring: baseline rate 150 moderate varibility,  +acel, no decel Uterine activity: occasional contraction, >5 min apart Dilation: 1.5 Effacement (%): Thick Station: -3 Exam by:: Milus Glazier RN  Prenatal labs: ABO, Rh: A/Positive/-- (09/27 1031) Antibody: Negative (09/27 1031) Rubella: 1.42 (09/27 1031) RPR: Non Reactive (12/31 0842)  HBsAg: Negative (09/27 1031)  HIV: Non Reactive (12/31 0842)  GC/Chlamydia: negative GBS:   negative 2-hr GTT: 94, 178, 111 --> postitive Genetic screening:  Normal Quad screen Anatomy US: normal anatomy at 19 weeks, girl  Prenatal Transfer Tool  Maternal Diabetes: Yes:  Diabetes Type:  Insulin/Medication controlled Genetic Screening: Normal Maternal Ultrasounds/Referrals: Normal Fetal Ultrasounds or other Referrals:  None Maternal Substance Abuse:  No Significant Maternal Medications:  Meds include: Other: Glyburide Significant Maternal Lab Results: Lab values include: Group B Strep negative  Results for orders placed or performed during the hospital encounter of 08/27/17 (from the past 24 hour(s))  CBC   Collection Time: 08/27/17  7:40 AM  Result Value Ref Range   WBC 10.1 4.0 - 10.5 K/uL   RBC 4.64 3.87 - 5.11 MIL/uL   Hemoglobin 10.1 (L) 12.0 - 15.0 g/dL   HCT 16.1 (L) 09.6 - 04.5 %   MCV 71.1 (L) 78.0 - 100.0 fL   MCH 21.8 (L) 26.0 - 34.0 pg   MCHC 30.6 30.0 - 36.0 g/dL   RDW 40.9 (H) 81.1 - 91.4 %   Platelets 269 150 - 400 K/uL    Patient Active Problem List   Diagnosis Date Noted  . Gestational diabetes mellitus (GDM) controlled on oral hypoglycemic drug 08/27/2017  . GDM, class A2 06/07/2017  . Anemia 04/06/2017  . IUD pregnancy 03/07/2017  . Supervision of high-risk pregnancy 03/02/2017  . Cervical dysplasia 03/02/2017    Assessment: Brittany Kerr is a 27 y.o. G2P1001 at [redacted]w[redacted]d here for IOL for A2GDM  #Labor: Start cervical ripening with cytotec; will attempt FB as well #Pain: Planning on getting epidural #FWB: Cat I #ID:  GBS neg #MOF: breast #MOC: likely  BTL (papers signed; reports being sure that she wants permanent sterilization, but she she was worried about side effects she heard about; we discussed these) #Circ:  N/a (girl)  Brittany Kerr 08/27/2017, 9:24 AM

## 2017-08-27 NOTE — Progress Notes (Signed)
LABOR PROGRESS NOTE  Brittany Kerr is a 27 y.o. G2P1001 at 2929w0d  admitted for IOL for A2GDM.  Subjective: Patient feeling strong contractions. Would like epidural  Objective: BP (!) 116/50 (BP Location: Right Arm)   Pulse 91   Temp (!) 97.5 F (36.4 C) (Oral)   Resp 16   Ht 5\' 1"  (1.549 m)   Wt 221 lb 3.2 oz (100.3 kg)   LMP 11/20/2016 (Exact Date)   BMI 41.80 kg/m  or  Vitals:   08/27/17 1400 08/27/17 1401 08/27/17 1501 08/27/17 1705  BP: 107/62 107/62 104/62 (!) 116/50  Pulse: 83 83 90 91  Resp: 18 16 16 16   Temp:    (!) 97.5 F (36.4 C)  TempSrc:    Oral  Weight:      Height:        SVE:  Dilation: 4.5 Effacement (%): 70 Cervical Position: Posterior Station: -2 Presentation: Vertex Exam by:: Doloris HallJenny Middleton RN FHT: baseline rate 150, moderate varibility, + acel, no decel Toco: ctx q 1-2  CBG (last 3)  Recent Labs    08/27/17 1130 08/27/17 1355 08/27/17 1709  GLUCAP 134* 66 102*     Assessment / Plan: 27 y.o. G2P1001 at 6929w0d here for IOL for A2GDM  Labor: s/p cytotec x 2 and FB. IV Pitocin started. Will plan to AROM after epidural Fetal Wellbeing:  Cat I Pain Control:  Will get epidural Anticipated MOD:  SVD A2GDM: CBGs now at goal  Frederik PearJulie P Esmay Amspacher, MD 08/27/2017, 5:23 PM

## 2017-08-27 NOTE — Progress Notes (Signed)
LABOR PROGRESS NOTE  Brittany IronJesica Kerr is a 27 y.o. G2P1001 at 189w0d  admitted for IOL for A2GDM  Subjective: Patient doing well. Denies any concerns  Objective: BP (!) 110/58   Pulse 78   Temp 98.1 F (36.7 C) (Oral)   Resp 18   Ht 5\' 1"  (1.549 m)   Wt 221 lb 3.2 oz (100.3 kg)   LMP 11/20/2016 (Exact Date)   BMI 41.80 kg/m  or  Vitals:   08/27/17 0907 08/27/17 1001 08/27/17 1127 08/27/17 1201  BP: (!) 111/56 (!) 113/57 101/87 (!) 110/58  Pulse: 80 77 75 78  Resp:  16 16 18   Temp:      TempSrc:      Weight:      Height:        SVE: 2 cm/30%/-3, posterior  FHT: baseline rate 150, moderate varibility, +acel, no decel Toco: ctx q ~3 min  CBG (last 3)  Recent Labs    08/27/17 1130  GLUCAP 134*    Assessment / Plan: 27 y.o. G2P1001 at 7789w0d here for IOL for A2GDM  Labor: FB and vaginal cytotec #2 placed Fetal Wellbeing:  Cat I Pain Control:  Per patient's request; planning on epidural Anticipated MOD:  SVD A2GDM: CBG 134, patient drank juice. Diet changed to carb-modified. Recheck CBG 2 hours from last. Start IV insulin gtt if CBG >140, or persistently > 120  Frederik PearJulie P Degele, MD 08/27/2017, 12:59 PM

## 2017-08-27 NOTE — Anesthesia Preprocedure Evaluation (Signed)
Anesthesia Evaluation  Patient identified by MRN, date of birth, ID band Patient awake    Reviewed: Allergy & Precautions, NPO status , Patient's Chart, lab work & pertinent test results  Airway Mallampati: II  TM Distance: >3 FB Neck ROM: Full    Dental no notable dental hx.    Pulmonary neg pulmonary ROS,    Pulmonary exam normal breath sounds clear to auscultation       Cardiovascular negative cardio ROS Normal cardiovascular exam Rhythm:Regular Rate:Normal     Neuro/Psych negative neurological ROS  negative psych ROS   GI/Hepatic negative GI ROS, Neg liver ROS,   Endo/Other  diabetes, GestationalMorbid obesity  Renal/GU negative Renal ROS     Musculoskeletal negative musculoskeletal ROS (+)   Abdominal (+) + obese,   Peds  Hematology negative hematology ROS (+) anemia ,   Anesthesia Other Findings   Reproductive/Obstetrics (+) Pregnancy                             Anesthesia Physical Anesthesia Plan  ASA: III  Anesthesia Plan: Epidural   Post-op Pain Management:    Induction:   PONV Risk Score and Plan:   Airway Management Planned:   Additional Equipment:   Intra-op Plan:   Post-operative Plan:   Informed Consent: I have reviewed the patients History and Physical, chart, labs and discussed the procedure including the risks, benefits and alternatives for the proposed anesthesia with the patient or authorized representative who has indicated his/her understanding and acceptance.     Plan Discussed with:   Anesthesia Plan Comments:         Anesthesia Quick Evaluation

## 2017-08-28 ENCOUNTER — Encounter: Payer: Self-pay | Admitting: *Deleted

## 2017-08-28 ENCOUNTER — Other Ambulatory Visit: Payer: Self-pay

## 2017-08-28 ENCOUNTER — Encounter (HOSPITAL_COMMUNITY): Payer: Self-pay

## 2017-08-28 LAB — RPR: RPR: NONREACTIVE

## 2017-08-28 LAB — GLUCOSE, CAPILLARY: Glucose-Capillary: 121 mg/dL — ABNORMAL HIGH (ref 65–99)

## 2017-08-28 MED ORDER — COCONUT OIL OIL: 1.0000 "application " | TOPICAL_OIL | Status: DC | PRN

## 2017-08-28 MED ORDER — BENZOCAINE-MENTHOL 20-0.5 % EX AERO
1.0000 "application " | INHALATION_SPRAY | CUTANEOUS | Status: DC | PRN
  Administered 2017-08-28: 1 via TOPICAL
  Filled 2017-08-28: qty 56

## 2017-08-28 MED ORDER — TETANUS-DIPHTH-ACELL PERTUSSIS 5-2.5-18.5 LF-MCG/0.5 IM SUSP: 0.5000 mL | Freq: Once | INTRAMUSCULAR | Status: DC

## 2017-08-28 MED ORDER — WITCH HAZEL-GLYCERIN EX PADS: 1.0000 "application " | MEDICATED_PAD | CUTANEOUS | Status: DC | PRN

## 2017-08-28 MED ORDER — IBUPROFEN 600 MG PO TABS
600.0000 mg | ORAL_TABLET | Freq: Four times a day (QID) | ORAL | Status: DC
  Administered 2017-08-28 – 2017-08-29 (×5): 600 mg via ORAL
  Filled 2017-08-28 (×5): qty 1

## 2017-08-28 MED ORDER — PRENATAL MULTIVITAMIN CH
1.0000 | ORAL_TABLET | Freq: Every day | ORAL | Status: DC
  Administered 2017-08-28: 1 via ORAL
  Filled 2017-08-28: qty 1

## 2017-08-28 MED ORDER — ONDANSETRON HCL 4 MG PO TABS: 4.0000 mg | ORAL_TABLET | ORAL | Status: DC | PRN

## 2017-08-28 MED ORDER — SENNOSIDES-DOCUSATE SODIUM 8.6-50 MG PO TABS
2.0000 | ORAL_TABLET | ORAL | Status: DC
  Administered 2017-08-28: 2 via ORAL
  Filled 2017-08-28: qty 2

## 2017-08-28 MED ORDER — ACETAMINOPHEN 325 MG PO TABS
650.0000 mg | ORAL_TABLET | ORAL | Status: DC | PRN
  Administered 2017-08-28 (×3): 650 mg via ORAL
  Filled 2017-08-28 (×3): qty 2

## 2017-08-28 MED ORDER — ZOLPIDEM TARTRATE 5 MG PO TABS: 5.0000 mg | ORAL_TABLET | Freq: Every evening | ORAL | Status: DC | PRN

## 2017-08-28 MED ORDER — DIPHENHYDRAMINE HCL 25 MG PO CAPS: 25.0000 mg | ORAL_CAPSULE | Freq: Four times a day (QID) | ORAL | Status: DC | PRN

## 2017-08-28 MED ORDER — DIBUCAINE 1 % RE OINT: 1.0000 "application " | TOPICAL_OINTMENT | RECTAL | Status: DC | PRN

## 2017-08-28 MED ORDER — SIMETHICONE 80 MG PO CHEW
80.0000 mg | CHEWABLE_TABLET | ORAL | Status: DC | PRN
  Administered 2017-08-28: 80 mg via ORAL
  Filled 2017-08-28: qty 1

## 2017-08-28 MED ORDER — ONDANSETRON HCL 4 MG/2ML IJ SOLN: 4.0000 mg | INTRAMUSCULAR | Status: DC | PRN

## 2017-08-28 NOTE — Progress Notes (Addendum)
POSTPARTUM PROGRESS NOTE  Post Partum Day 1 Subjective:  Brittany Kerr is a 27 y.o. [id number] 2926w0d s/p SVD.  No acute events overnight.  Pt denies problems with ambulating, voiding or po intake.  She denies nausea or vomiting.  Pain is well controlled. Lochia Minimal.   Objective: Blood pressure (!) 118/59, pulse 81, temperature 98.9 F (37.2 C), temperature source Oral, resp. rate 18, height 5\' 1"  (1.549 m), weight 221 lb 3.2 oz (100.3 kg), last menstrual period 11/20/2016, SpO2 98 %, unknown if currently breastfeeding.  Physical Exam:  General: alert, cooperative and no distress Lochia:normal flow Chest: no respiratory distress Heart:regular rate Abdomen: soft, nontender,  Uterine Fundus: firm, appropriately tender DVT Evaluation: No calf swelling or tenderness Extremities: no edema  Recent Labs    08/27/17 0740  HGB 10.1*  HCT 33.0*    Assessment/Plan:  ASSESSMENT: Brittany Kerr is a 27 y.o. [id number] 5326w0d s/p SVD.  Plan for discharge tomorrow, Breastfeeding and Contraception planned vasectomy   LOS: 1 day   Ellwood Denselison Mehreen Azizi, DO 08/28/2017, 6:34 AM

## 2017-08-28 NOTE — Anesthesia Postprocedure Evaluation (Signed)
Anesthesia Post Note  Patient: Brittany Kerr  Procedure(s) Performed: AN AD HOC LABOR EPIDURAL     Patient location during evaluation: Mother Baby Anesthesia Type: Epidural Level of consciousness: awake and alert Pain management: pain level controlled Vital Signs Assessment: post-procedure vital signs reviewed and stable Respiratory status: spontaneous breathing, nonlabored ventilation and respiratory function stable Cardiovascular status: stable Postop Assessment: no headache, no backache and epidural receding Anesthetic complications: no    Last Vitals:  Vitals:   08/28/17 0133 08/28/17 0507  BP: (!) 115/50 (!) 118/59  Pulse: 96 81  Resp: 18 18  Temp: 37.1 C 37.2 C  SpO2:      Last Pain:  Vitals:   08/28/17 0507  TempSrc: Oral  PainSc:    Pain Goal:                 [name],Brittany Kerr

## 2017-08-28 NOTE — Lactation Note (Signed)
This note was copied from a baby's chart. Lactation Consultation Note Baby 3 hrs old. Mom states baby has latched well a couple of times since birth.  Mom BF her now 4220 months old for 3 months. Mom was Breast/formula with her last child. Mom is going to try to just BF for now, but stated she will use formula if she needs to.  Discussed using colostrum as supplement. Hand expression w/colostrum noted. Encouraged mom to assess for transfer after feeding. Reviewed newborn feeding habits, STS, I&O, and cluster feeding. Mom encouraged to feed baby 8-12 times/24 hours and with feeding cues.   [facility] brochure given w/resources, support groups and LC services.  Patient Name: Brittany Kerr ZOXWR'UToday's Date: 08/28/2017 Reason for consult: Initial assessment   Maternal Data Has patient been taught Hand Expression?: Yes Does the patient have breastfeeding experience prior to this delivery?: Yes  Feeding Feeding Type: Breast Fed Length of feed: 30 min  LATCH Score Latch: Repeated attempts needed to sustain latch, nipple held in mouth throughout feeding, stimulation needed to elicit sucking reflex.  Audible Swallowing: A few with stimulation  Type of Nipple: Everted at rest and after stimulation  Comfort (Breast/Nipple): Soft / non-tender  Hold (Positioning): Assistance needed to correctly position infant at breast and maintain latch.  LATCH Score: 7  Interventions Interventions: Breast feeding basics reviewed;Support pillows;Position options;Breast massage;Hand express;Breast compression  Lactation Tools Discussed/Used WIC Program: No   Consult Status Consult Status: Follow-up Date: 08/29/17 Follow-up type: In-patient    [name], Diamond NickelLAURA G 08/28/2017, 2:11 AM

## 2017-08-28 NOTE — Progress Notes (Signed)
FBG due this am at 0600 - not done.  MD aware. This RN given verbal orders for 2 hour postprandial CBG today (due at noon) and FBG tomorrow at 0600 (3/26).

## 2017-08-29 MED ORDER — IBUPROFEN 600 MG PO TABS: 600.0000 mg | ORAL_TABLET | Freq: Four times a day (QID) | ORAL | 0 refills | Status: DC

## 2017-08-29 NOTE — Discharge Instructions (Signed)
Vaginal Delivery, Care After °Refer to this sheet in the next few weeks. These instructions provide you with information about caring for yourself after vaginal delivery. Your health care provider may also give you more specific instructions. Your treatment has been planned according to current medical practices, but problems sometimes occur. Call your health care provider if you have any problems or questions. °What can I expect after the procedure? °After vaginal delivery, it is common to have: °· Some bleeding from your vagina. °· Soreness in your abdomen, your vagina, and the area of skin between your vaginal opening and your anus (perineum). °· Pelvic cramps. °· Fatigue. ° °Follow these instructions at home: °Medicines °· Take over-the-counter and prescription medicines only as told by your health care provider. °· If you were prescribed an antibiotic medicine, take it as told by your health care provider. Do not stop taking the antibiotic until it is finished. °Driving ° °· Do not drive or operate heavy machinery while taking prescription pain medicine. °· Do not drive for 24 hours if you received a sedative. °Lifestyle °· Do not drink alcohol. This is especially important if you are breastfeeding or taking medicine to relieve pain. °· Do not use tobacco products, including cigarettes, chewing tobacco, or e-cigarettes. If you need help quitting, ask your health care provider. °Eating and drinking °· Drink at least 8 eight-ounce glasses of water every day unless you are told not to by your health care provider. If you choose to breastfeed your baby, you may need to drink more water than this. °· Eat high-fiber foods every day. These foods may help prevent or relieve constipation. High-fiber foods include: °? Whole grain cereals and breads. °? Brown rice. °? Beans. °? Fresh fruits and vegetables. °Activity °· Return to your normal activities as told by your health care provider. Ask your health care provider  what activities are safe for you. °· Rest as much as possible. Try to rest or take a nap when your baby is sleeping. °· Do not lift anything that is heavier than your baby or 10 lb (4.5 kg) until your health care provider says that it is safe. °· Talk with your health care provider about when you can engage in sexual activity. This may depend on your: °? Risk of infection. °? Rate of healing. °? Comfort and desire to engage in sexual activity. °Vaginal Care °· If you have an episiotomy or a vaginal tear, check the area every day for signs of infection. Check for: °? More redness, swelling, or pain. °? More fluid or blood. °? Warmth. °? Pus or a bad smell. °· Do not use tampons or douches until your health care provider says this is safe. °· Watch for any blood clots that may pass from your vagina. These may look like clumps of dark red, brown, or black discharge. °General instructions °· Keep your perineum clean and dry as told by your health care provider. °· Wear loose, comfortable clothing. °· Wipe from front to back when you use the toilet. °· Ask your health care provider if you can shower or take a bath. If you had an episiotomy or a perineal tear during labor and delivery, your health care provider may tell you not to take baths for a certain length of time. °· Wear a bra that supports your breasts and fits you well. °· If possible, have someone help you with household activities and help care for your baby for at least a few days after   you leave the hospital. °· Keep all follow-up visits for you and your baby as told by your health care provider. This is important. °Contact a health care provider if: °· You have: °? Vaginal discharge that has a bad smell. °? Difficulty urinating. °? Pain when urinating. °? A sudden increase or decrease in the frequency of your bowel movements. °? More redness, swelling, or pain around your episiotomy or vaginal tear. °? More fluid or blood coming from your episiotomy or  vaginal tear. °? Pus or a bad smell coming from your episiotomy or vaginal tear. °? A fever. °? A rash. °? Little or no interest in activities you used to enjoy. °? Questions about caring for yourself or your baby. °· Your episiotomy or vaginal tear feels warm to the touch. °· Your episiotomy or vaginal tear is separating or does not appear to be healing. °· Your breasts are painful, hard, or turn red. °· You feel unusually sad or worried. °· You feel nauseous or you vomit. °· You pass large blood clots from your vagina. If you pass a blood clot from your vagina, save it to show to your health care provider. Do not flush blood clots down the toilet without having your health care provider look at them. °· You urinate more than usual. °· You are dizzy or light-headed. °· You have not breastfed at all and you have not had a menstrual period for 12 weeks after delivery. °· You have stopped breastfeeding and you have not had a menstrual period for 12 weeks after you stopped breastfeeding. °Get help right away if: °· You have: °? Pain that does not go away or does not get better with medicine. °? Chest pain. °? Difficulty breathing. °? Blurred vision or spots in your vision. °? Thoughts about hurting yourself or your baby. °· You develop pain in your abdomen or in one of your legs. °· You develop a severe headache. °· You faint. °· You bleed from your vagina so much that you fill two sanitary pads in one hour. °This information is not intended to replace advice given to you by your health care provider. Make sure you discuss any questions you have with your health care provider. °Document Released: 05/20/2000 Document Revised: 11/04/2015 Document Reviewed: 06/07/2015 °Elsevier Interactive Patient Education © 2018 [employer] Inc[employer] ° °

## 2017-08-29 NOTE — Discharge Summary (Addendum)
OB Discharge Summary    Patient Name: Brittany Kerr DOB: 1990/08/30 MRN: [id number]  Date of admission: 08/27/2017 Delivering MD: Frederik PearEGELE, JULIE P   Date of discharge: 08/29/2017  Admitting diagnosis: INDUCTION Intrauterine pregnancy: 7638w0d     Secondary diagnosis:  Principal Problem:   SVD (spontaneous vaginal delivery) Active Problems:   Supervision of high-risk pregnancy   Anemia   Gestational diabetes mellitus (GDM) controlled on oral hypoglycemic drug  Additional problems: A2GDM on glyburide, IUD pregnancy (on paraguard, removed at 5wks), anemia     Discharge diagnosis: Term Pregnancy Delivered and GDM A2                                                                                               Post partum procedures: None  Augmentation: AROM, Pitocin and Cytotec  Complications: None  Hospital course:  Induction of Labor With Vaginal Delivery   27 y.o. yo [id number] at 7738w0d was admitted to the hospital 08/27/2017 for induction of labor.  Indication for induction: A2 DM.  Patient had an uncomplicated labor course as follows: Membrane Rupture Time/Date: 9:04 PM ,08/27/2017   Intrapartum Procedures: Episiotomy: None [1]                                         Lacerations:  2nd degree [3];Perineal [11]  Patient had delivery of a Viable infant.  Information for the patient's newborn:  Nigel BertholdRodriguez, Girl Pricella [[id number]][[id number]]  Delivery Method: Vaginal, Spontaneous(Filed from Delivery Summary)   08/27/2017  Details of delivery can be found in separate delivery note.  Patient had a routine postpartum course. 2 hour postprandial CBG within normal limits (121). Patient is discharged home 08/29/17.  Physical exam  Vitals:   08/28/17 0133 08/28/17 0507 08/28/17 1455 08/28/17 1923  BP: (!) 115/50 (!) 118/59 (!) 105/56 (!) 115/53  Pulse: 96 81 89 95  Resp: 18 18 18 18   Temp: 98.7 F (37.1 C) 98.9 F (37.2 C) 98.1 F (36.7 C) 98.7 F (37.1 C)  TempSrc: Axillary Oral Oral Oral   SpO2:    98%  Weight:      Height:       General: alert, cooperative and no distress Lochia: appropriate Uterine Fundus: firm Incision: N/A DVT Evaluation: No evidence of DVT seen on physical exam. Labs: Lab Results  Component Value Date   WBC 10.1 08/27/2017   HGB 10.1 (L) 08/27/2017   HCT 33.0 (L) 08/27/2017   MCV 71.1 (L) 08/27/2017   PLT 269 08/27/2017   CMP Latest Ref Rng & Units 01/06/2017  Glucose 65 - 99 mg/dL 130(Q101(H)  BUN 6 - 20 mg/dL 11  Creatinine 6.570.44 - 8.461.00 mg/dL 9.620.59  Sodium 952135 - 841145 mmol/L 134(L)  Potassium 3.5 - 5.1 mmol/L 4.2  Chloride 101 - 111 mmol/L 101  CO2 22 - 32 mmol/L 25  Calcium 8.9 - 10.3 mg/dL 9.2  Total Protein 6.5 - 8.1 g/dL 7.8  Total Bilirubin 0.3 - 1.2 mg/dL 0.5  Alkaline Phos 38 - 126 U/L  70  AST 15 - 41 U/L 21  ALT 14 - 54 U/L 24    Discharge instruction: per After Visit Summary and "Baby and Me Booklet".  After visit meds:  Allergies as of 08/29/2017   No Known Allergies     Medication List    STOP taking these medications   ACCU-CHEK FASTCLIX LANCETS Misc   glucose blood test strip Commonly known as:  ACCU-CHEK GUIDE   glyBURIDE 2.5 MG tablet Commonly known as:  DIABETA   ondansetron 4 MG disintegrating tablet Commonly known as:  ZOFRAN ODT     TAKE these medications   ibuprofen 600 MG tablet Commonly known as:  ADVIL,MOTRIN Take 1 tablet (600 mg total) by mouth every 6 (six) hours.   prenatal multivitamin Tabs tablet Take 1 tablet by mouth daily at 12 noon.   ranitidine 150 MG tablet Commonly known as:  ZANTAC Take 1 tablet (150 mg total) by mouth 2 (two) times daily.       Diet: routine diet  Activity: Advance as tolerated. Pelvic rest for 6 weeks.   Outpatient follow up: 4 weeks Follow up Appt:No future appointments. Follow up Visit: Follow-up Information    [facility]   Specialty:  Obstetrics and Gynecology Why:  in 4 weeks Contact information: [address] [place] [address] [phone]         Postpartum contraception: Vasectomy  Newborn Data: Live born female  Birth Weight: 7 lb 7.4 oz (3385 g) APGAR: 9, 9  Newborn Delivery   Birth date/time:  08/27/2017 22:23:00 Delivery type:  Vaginal, Spontaneous     Baby Feeding: Bottle and Breast Disposition:home with mother   08/29/2017 Ellwood Dense, DO  OB FELLOW DISCHARGE ATTESTATION  I have seen and examined this patient. I agree with above documentation and have made edits as needed.   Caryl Ada, DO OB Fellow 7:47 AM

## 2017-08-29 NOTE — Lactation Note (Signed)
This note was copied from a baby's chart. Lactation Consultation Note  Patient Name: Brittany Kerr ZOXWR'UToday's Date: 08/29/2017 Reason for consult: Follow-up assessment   Baby 36 hours old and mother is breastfeeding and formula feeding. Mom encouraged to feed baby 8-12 times/24 hours and with feeding cues.  Recommend breastfeeding before offering formula to help her milk supply become established. Reviewed engorgement care and monitoring voids/stools.      Maternal Data    Feeding Feeding Type: Breast Fed  LATCH Score Latch: Grasps breast easily, tongue down, lips flanged, rhythmical sucking.  Audible Swallowing: A few with stimulation  Type of Nipple: Everted at rest and after stimulation  Comfort (Breast/Nipple): Soft / non-tender  Hold (Positioning): No assistance needed to correctly position infant at breast.  LATCH Score: 9  Interventions    Lactation Tools Discussed/Used     Consult Status Consult Status: Complete    Hardie PulleyBerkelhammer, Koty Anctil Boschen 08/29/2017, 10:34 AM

## 2017-09-08 ENCOUNTER — Encounter (INDEPENDENT_AMBULATORY_CARE_PROVIDER_SITE_OTHER): Payer: Self-pay

## 2017-10-09 ENCOUNTER — Ambulatory Visit (INDEPENDENT_AMBULATORY_CARE_PROVIDER_SITE_OTHER): Payer: Medicaid Other | Admitting: Advanced Practice Midwife

## 2017-10-09 ENCOUNTER — Encounter: Payer: Self-pay | Admitting: Advanced Practice Midwife

## 2017-10-09 ENCOUNTER — Ambulatory Visit (INDEPENDENT_AMBULATORY_CARE_PROVIDER_SITE_OTHER): Payer: Medicaid Other | Admitting: Clinical

## 2017-10-09 ENCOUNTER — Other Ambulatory Visit: Payer: Medicaid Other

## 2017-10-09 DIAGNOSIS — Z3202 Encounter for pregnancy test, result negative: Secondary | ICD-10-CM

## 2017-10-09 DIAGNOSIS — Z30017 Encounter for initial prescription of implantable subdermal contraceptive: Secondary | ICD-10-CM | POA: Diagnosis not present

## 2017-10-09 DIAGNOSIS — O99345 Other mental disorders complicating the puerperium: Secondary | ICD-10-CM

## 2017-10-09 DIAGNOSIS — F4323 Adjustment disorder with mixed anxiety and depressed mood: Secondary | ICD-10-CM | POA: Diagnosis not present

## 2017-10-09 DIAGNOSIS — Z3009 Encounter for other general counseling and advice on contraception: Secondary | ICD-10-CM

## 2017-10-09 DIAGNOSIS — Z8632 Personal history of gestational diabetes: Secondary | ICD-10-CM

## 2017-10-09 DIAGNOSIS — F53 Postpartum depression: Secondary | ICD-10-CM

## 2017-10-09 LAB — POCT PREGNANCY, URINE: PREG TEST UR: NEGATIVE

## 2017-10-09 MED ORDER — ETONOGESTREL 68 MG ~~LOC~~ IMPL
68.0000 mg | DRUG_IMPLANT | Freq: Once | SUBCUTANEOUS | Status: AC
  Administered 2017-10-09: 68 mg via SUBCUTANEOUS

## 2017-10-09 NOTE — Patient Instructions (Signed)
Nexplanon Instructions After Insertion   Keep bandage clean and dry for 24 hours   May use ice/Tylenol/Ibuprofen for soreness or pain   If you develop fever, drainage or increased warmth from incision site-contact office immediately   Perinatal Depression When a woman feels excessive sadness, anger, or anxiety during pregnancy or during the first 12 months after she gives birth, she has a condition called perinatal depression. Depression can interfere with work, school, relationships, and other everyday activities. If it is not managed properly, it can also cause problems in the mother and her baby. Sometimes, perinatal depression is left untreated because symptoms are thought to be normal mood swings during and right after pregnancy. If you have symptoms of depression, it is important to talk with your health care provider. What are the causes? The exact cause of this condition is not known. Hormonal changes during and after pregnancy may play a role in causing perinatal depression. What increases the risk? You are more likely to develop this condition if:  You have a personal or family history of depression, anxiety, or mood disorders.  You experience a stressful life event during pregnancy, such as the death of a loved one.  You have a lot of regular life stress.  You do not have support from family members or loved ones, or you are in an abusive relationship.  What are the signs or symptoms? Symptoms of this condition include:  Feeling sad or hopeless.  Feelings of guilt.  Feeling irritable or overwhelmed.  Changes in your appetite.  Lack of energy or motivation.  Sleep problems.  Difficulty concentrating or completing tasks.  Loss of interest in hobbies or relationships.  Headaches or stomach problems that do not go away.  How is this diagnosed? This condition is diagnosed based on a physical exam and mental evaluation. In some cases, your health care provider  may use a depression screening tool. These tools include a list of questions that can help a health care provider diagnose depression. Your health care provider may refer you to a mental health expert who specializes in depression. How is this treated? This condition may be treated with:  Medicines. Your health care provider will only give you medicines that have been proven safe for pregnancy and breastfeeding.  Talk therapy with a mental health professional to help change your patterns of thinking (cognitive behavioral therapy).  Support groups.  Brain stimulation or light therapies.  Stress reduction therapies, such as mindfulness.  Follow these instructions at home: Lifestyle  Do not use any products that contain nicotine or tobacco, such as cigarettes and e-cigarettes. If you need help quitting, ask your health care provider.  Do not use alcohol when you are pregnant. After your baby is born, limit alcohol intake to no more than 1 drink a day. One drink equals 12 oz of beer, 5 oz of wine, or 1 oz of hard liquor.  Consider joining a support group for new mothers. Ask your health care provider for recommendations.  Take good care of yourself. Make sure you: ? Get plenty of sleep. If you are having trouble sleeping, talk with your health care provider. ? Eat a healthy diet. This includes plenty of fruits and vegetables, whole grains, and lean proteins. ? Exercise regularly, as told by your health care provider. Ask your health care provider what exercises are safe for you. General instructions  Take over-the-counter and prescription medicines only as told by your health care provider.  Talk with your partner  or family members about your feelings during pregnancy. Share any concerns or anxieties that you may have.  Ask for help with tasks or chores when you need it. Ask friends and family members to provide meals, watch your children, or help with cleaning.  Keep all follow-up  visits as told by your health care provider. This is important. Contact a health care provider if:  You (or people close to you) notice that you have any symptoms of depression.  You have depression and your symptoms get worse.  You experience side effects from medicines, such as nausea or sleep problems. Get help right away if:  You feel like hurting yourself, your baby, or someone else. If you ever feel like you may hurt yourself or others, or have thoughts about taking your own life, get help right away. You can go to your nearest emergency department or call:  Your local emergency services (911 in the [place]  A suicide crisis helpline, such as the National Suicide Prevention Lifeline at (917)739-3946. This is open 24 hours a day.  Summary  Perinatal depression is when a woman feels excessive sadness, anger, or anxiety during pregnancy or during the first 12 months after she gives birth.  If perinatal depression is not treated, it can lead to health problems for the mother and her baby.  This condition is treated with medicines, talk therapy, stress reduction therapies, or a combination of two or more treatments.  Talk with your partner or family members about your feelings. Do not be afraid to ask for help. This information is not intended to replace advice given to you by your health care provider. Make sure you discuss any questions you have with your health care provider. Document Released: 07/20/2016 Document Revised: 07/20/2016 Document Reviewed: 07/20/2016 Elsevier Interactive Patient Education  [employer].

## 2017-10-09 NOTE — Progress Notes (Addendum)
refSubjective:     Brittany Kerr is a 27 y.o. female who presents for a postpartum visit. She is 6 weeks postpartum following a spontaneous vaginal delivery. I have fully reviewed the prenatal and intrapartum course. The delivery was at 39 gestational weeks. Outcome: spontaneous vaginal delivery. Anesthesia: epidural. Postpartum course has been uncomplicated. Baby's course has been uncomplicated. Baby is feeding by breast. Bleeding thin lochia. Bowel function is normal. Bladder function is normal. Patient is not sexually active. Contraception method is none. Postpartum depression screening: positive. Patient and/or legal guardian verbally consented to meet with Behavioral Health Clinician about presenting concerns.  Patient is interested in Nexplanon for birth control. She was planning on BTL, but husband now planning vasectomy. This has not been scheduled, so she would like Nexplanon today.   Patient's husband has recently been deported. She is living with her mom and family members. Her mom is helping her with the children.   The following portions of the patient's history were reviewed and updated as appropriate: allergies, current medications, past family history, past medical history, past social history, past surgical history and problem list.  Review of Systems Pertinent items are noted in HPI.   Objective:    There were no vitals taken for this visit.  General:  alert and cooperative   Breasts:  inspection negative, no nipple discharge or bleeding, no masses or nodularity palpable  Lungs: clear to auscultation bilaterally  Heart:  regular rate and rhythm, S1, S2 normal, no murmur, click, rub or gallop  Abdomen: soft, non-tender; bowel sounds normal; no masses,  no organomegaly   Vulva:  not evaluated  Vagina: not evaluated  Cervix:  not evaluated   Corpus: not examined  Adnexa:  not evaluated  Rectal Exam: Not performed.         GYNECOLOGY OFFICE PROCEDURE NOTE  Brittany Kerr is a 27 y.o. [id number] here for Nexplanon insertion.  Last pap smear was on 02/2017 and was normal.  No other gynecologic concerns.  Nexplanon Insertion Procedure Patient identified, informed consent performed, consent signed.   Patient does understand that irregular bleeding is a very common side effect of this medication. She was advised to have backup contraception for one week after placement. Pregnancy test in clinic today was negative.  Appropriate time out taken.  Patient's left arm was prepped and draped in the usual sterile fashion. The ruler used to measure and mark insertion area.  Patient was prepped with alcohol swab and then injected with 3 ml of 1% lidocaine.  She was prepped with betadine, Nexplanon removed from packaging,  Device confirmed in needle, then inserted full length of needle and withdrawn per handbook instructions. Nexplanon was able to palpated in the patient's arm; patient palpated the insert herself. There was minimal blood loss.  Patient insertion site covered with guaze and a pressure bandage to reduce any bruising.  The patient tolerated the procedure well and was given post procedure instructions.    Assessment:      1. Postpartum care and examination   2. Birth control counseling   3. Nexplanon insertion   4. Postpartum depression    Routine postpartum exam. Pap smear not done at today's visit.   Plan:    1. Contraception: Nexplanon 2. Routine care 3. Follow up in: 1 year  or as needed.   4. PP depression. Patient will meet with Asher Muir today

## 2017-10-09 NOTE — BH Specialist Note (Signed)
Integrated Behavioral Health Initial Visit  MRN: [id number] Name: Daviona Herbert  Number of Integrated Behavioral Health Clinician visits:: 1/6 Session Start time: 9:10  Session End time: 9:30 Total time: 20 minutes  Type of Service: Integrated Behavioral Health- Individual/Family Interpretor:No. Interpretor Name and Language: n/a   Warm Hand Off Completed.       SUBJECTIVE: Almarosa Bohac is a 27 y.o. female accompanied by n/a Patient was referred by Thressa Sheller, CNM for depression/anxiety. Patient reports the following symptoms/concerns: Pt states her primary concern today is feeling sad and anxious after her husband was deported after an expired visa, shortly after she gave birth. Pt is having a difficult time sleeping and eating postpartum.  Duration of problem: Over one month; Severity of problem: moderate  OBJECTIVE: Mood: Anxious and Depressed and Affect: Tearful Risk of harm to self or others: Intention to act on plan to harm others  LIFE CONTEXT: Family and Social: Pt lives with her 2 children(newborn and toddler), her mother and siblings; husband in [place] after expired visa School/Work: - Self-Care: - Life Changes: Postpartum; husband deported to [place]   GOALS ADDRESSED: Patient will: 1. Reduce symptoms of: anxiety, depression and stress 2. Increase knowledge and/or ability of: stress reduction  3. Demonstrate ability to: Increase healthy adjustment to current life circumstances and Increase adequate support systems for patient/family  INTERVENTIONS: Interventions utilized: Sleep Hygiene, Psychoeducation and/or Health Education and Link to [facility]  Standardized Assessments completed: Inocente Salles Postnatal Depression  ASSESSMENT: Patient currently experiencing Adjustment disorder with mixed anxious and depressed mood.   Patient may benefit from psychoeducation and brief therapeutic interventions regarding coping with symptoms of anxiety and  depression .  PLAN: 1. Follow up with behavioral health clinician on : As requested by pt 2. Behavioral recommendations:  -Continue to take daily morning naps -Consider sleep app for improved family sleep -Contact [employer] for additional family support -Read educational materials regarding coping with symptoms of anxiety and depression  3. Referral(s): Integrated Art gallery manager (In Clinic) and [employer]:  [employer] and [employer] 4. "From scale of 1-10, how likely are you to follow plan?": 9  Rae Lips, LCSW  Depression screen [facility] 2/9 08/23/2017 08/16/2017 08/09/2017 08/02/2017 07/26/2017  Decreased Interest 0 0 0 0 0  Down, Depressed, Hopeless 0 0 0 0 0  PHQ - 2 Score 0 0 0 0 0  Altered sleeping 0 Tired, decreased energy 0 3 2 0 1  Change in appetite 0 1 2 0 0  Feeling bad or failure about yourself  0 0 0 0 0  Trouble concentrating 0 0 0 0 0  Moving slowly or fidgety/restless 0 0 0 0 0  Suicidal thoughts 0 0 0 0 0  PHQ-9 Score 0 Some recent data might be hidden   GAD 7 : Generalized Anxiety Score 08/23/2017 08/16/2017 08/09/2017 08/02/2017  Nervous, Anxious, on Edge 1 0 0 0  Control/stop worrying 0 0 0 0  Worry too much - different things 0 0 0 0  Trouble relaxing 0 0 0 0  Restless 0 0 0 0  Easily annoyed or irritable 0 1 1 0  Afraid - awful might happen 0 0 0 0  Total GAD 7 Score 0   Edinburgh Postnatal Depression Scale - 10/09/17 0830      Edinburgh Postnatal Depression Scale:  In the Past 7 Days   I have been  able to laugh and see the funny side of things.  1    I have looked forward with enjoyment to things.  1    I have blamed myself unnecessarily when things went wrong.  0    I have been anxious or worried for no good reason.  0    I have felt scared or panicky for no good reason.  2    Things have been getting on top of me.  1    I have been so unhappy that I have had difficulty  sleeping.  2    I have felt sad or miserable.  2    I have been so unhappy that I have been crying.  2    The thought of harming myself has occurred to me.  0    Edinburgh Postnatal Depression Scale Total  11

## 2017-10-10 ENCOUNTER — Encounter: Payer: Self-pay | Admitting: *Deleted

## 2017-10-13 LAB — GLUCOSE TOLERANCE, 2 HOURS
GLUCOSE, 2 HOUR: 115 mg/dL (ref 65–139)
Glucose, GTT - Fasting: 88 mg/dL (ref 65–99)

## 2017-10-16 ENCOUNTER — Telehealth: Payer: Self-pay | Admitting: Clinical

## 2017-10-16 NOTE — Telephone Encounter (Signed)
Attempt to f/u, as requested by pt; busy signal with no voicemail, so no message left for pt.   Pt may call Asher Muir at [facility] for [facility] at [facility] at [phone].

## 2017-10-19 ENCOUNTER — Telehealth: Payer: Self-pay | Admitting: Advanced Practice Midwife

## 2017-10-19 NOTE — Telephone Encounter (Signed)
Pt wants GTT results from 10/09/17. Please call pt.

## 2017-10-23 NOTE — Telephone Encounter (Signed)
Called [place] and phone rang many times but no one picked up and no voicemail picked up. Will send MyChart message.

## 2017-11-01 ENCOUNTER — Ambulatory Visit (INDEPENDENT_AMBULATORY_CARE_PROVIDER_SITE_OTHER): Payer: Medicaid Other | Admitting: Advanced Practice Midwife

## 2017-11-01 ENCOUNTER — Encounter: Payer: Self-pay | Admitting: Advanced Practice Midwife

## 2017-11-01 VITALS — BP 115/64 | HR 84 | Ht 61.0 in | Wt 192.0 lb

## 2017-11-01 DIAGNOSIS — Z3009 Encounter for other general counseling and advice on contraception: Secondary | ICD-10-CM

## 2017-11-01 NOTE — Progress Notes (Signed)
Wants nexplanon removed due to continuing to bleed, frequent headaches, not losing weight.

## 2017-11-01 NOTE — Patient Instructions (Signed)
Laparoscopic Tubal Ligation, Care After °Refer to this sheet in the next few weeks. These instructions provide you with information about caring for yourself after your procedure. Your health care provider may also give you more specific instructions. Your treatment has been planned according to current medical practices, but problems sometimes occur. Call your health care provider if you have any problems or questions after your procedure. °What can I expect after the procedure? °After the procedure, it is common to have: °· A sore throat. °· Discomfort in your shoulder. °· Mild discomfort or cramping in your abdomen. °· Gas pains. °· Pain or soreness in the area where the surgical cut (incision) was made. °· A bloated feeling. °· Tiredness. °· Nausea. °· Vomiting. ° °Follow these instructions at home: °Medicines °· Take over-the-counter and prescription medicines only as told by your health care provider. °· Do not take aspirin because it can cause bleeding. °· Do not drive or operate heavy machinery while taking prescription pain medicine. °Activity °· Rest for the rest of the day. °· Return to your normal activities as told by your health care provider. Ask your health care provider what activities are safe for you. °Incision care ° °· Follow instructions from your health care provider about how to take care of your incision. Make sure you: °? Wash your hands with soap and water before you change your bandage (dressing). If soap and water are not available, use hand sanitizer. °? Change your dressing as told by your health care provider. °? Leave stitches (sutures) in place. They may need to stay in place for 2 weeks or longer. °· Check your incision area every day for signs of infection. Check for: °? More redness, swelling, or pain. °? More fluid or blood. °? Warmth. °? Pus or a bad smell. °Other Instructions °· Do not take baths, swim, or use a hot tub until your health care provider approves. You may take  showers. °· Keep all follow-up visits as told by your health care provider. This is important. °· Have someone help you with your daily household tasks for the first few days. °Contact a health care provider if: °· You have more redness, swelling, or pain around your incision. °· Your incision feels warm to the touch. °· You have pus or a bad smell coming from your incision. °· The edges of your incision break open after the sutures have been removed. °· Your pain does not improve after 2-3 days. °· You have a rash. °· You repeatedly become dizzy or light-headed. °· Your pain medicine is not helping. °· You are constipated. °Get help right away if: °· You have a fever. °· You faint. °· You have increasing pain in your abdomen. °· You have severe pain in one or both of your shoulders. °· You have fluid or blood coming from your sutures or from your vagina. °· You have shortness of breath or difficulty breathing. °· You have chest pain or leg pain. °· You have ongoing nausea, vomiting, or diarrhea. °This information is not intended to replace advice given to you by your health care provider. Make sure you discuss any questions you have with your health care provider. °Document Released: 12/10/2004 Document Revised: 10/26/2015 Document Reviewed: 05/03/2015 °Elsevier Interactive Patient Education © 2018 [employer] Inc. ° °

## 2017-11-01 NOTE — Progress Notes (Signed)
  Subjective:     Patient ID: Brittany Kerr, female   DOB: 1991/02/14, 27 y.o.   MRN: [id number]  Brittany Kerr is a 27 y.o. [id number] who is here today requesting Nexplanon removal. She had Nexplanon placed on 10/09/2017 (23 days ago). She reports that she hasn't been able to lose any weight. She reports that she is eating approx 1200 calories per day and going to the gym 6 days per week. She is also having headaches. She reports one headache per day, but approx 3 per week that she has to take medication for. She was planning BTL, then husband was going to have a vasectomy. He has not been able to get a vasectomy done because he was deported. Patient is traveling back and forth from [place] to visit him.     Review of Systems  All other systems reviewed and are negative.      Objective:   Physical Exam  Constitutional: She is oriented to person, place, and time. She appears well-developed and well-nourished.  HENT:  Head: Normocephalic.  Cardiovascular: Normal rate.  Pulmonary/Chest: Effort normal.  Neurological: She is alert and oriented to person, place, and time.  Psychiatric: She has a normal mood and affect.  Nursing note and vitals reviewed.  Assessment:     1. Birth control counseling        Plan:     Thorough discussion with the patient regarding options at this time. I do recommend leaving Nexplanon in place for a little longer to see if side effects improve. She is agreeable to this. She would consider BTL if she continues to have side effects. Plan for patient to return for BTL papers in about 3-6 months depending on travel plans. She does have family planning medicaid and confirmed with [place] that this does cover BTL. Patient aware that papers have to be on file for 31 days for family planning medicaid.   Thressa Sheller 2:38 PM 11/01/17

## 2017-11-16 ENCOUNTER — Other Ambulatory Visit: Payer: Self-pay

## 2017-11-16 ENCOUNTER — Ambulatory Visit (INDEPENDENT_AMBULATORY_CARE_PROVIDER_SITE_OTHER): Payer: Self-pay

## 2017-11-16 VITALS — BP 127/70 | HR 69 | Wt 188.0 lb

## 2017-11-16 DIAGNOSIS — R300 Dysuria: Secondary | ICD-10-CM

## 2017-11-16 DIAGNOSIS — R319 Hematuria, unspecified: Secondary | ICD-10-CM

## 2017-11-16 LAB — POCT URINALYSIS DIP (DEVICE)
Bilirubin Urine: NEGATIVE
Glucose, UA: NEGATIVE mg/dL
Ketones, ur: NEGATIVE mg/dL
Nitrite: NEGATIVE
Protein, ur: NEGATIVE mg/dL
Specific Gravity, Urine: 1.025 (ref 1.005–1.030)
Urobilinogen, UA: 0.2 mg/dL (ref 0.0–1.0)
pH: 5.5 (ref 5.0–8.0)

## 2017-11-16 MED ORDER — SULFAMETHOXAZOLE-TRIMETHOPRIM 800-160 MG PO TABS: 1.0000 | ORAL_TABLET | Freq: Two times a day (BID) | ORAL | 0 refills | Status: DC

## 2017-11-16 NOTE — Progress Notes (Signed)
Chart reviewed for nurse visit. Agree with plan of care.   Rilea Arutyunyan, NP 11/16/2017 12:27 PM   

## 2017-11-16 NOTE — Progress Notes (Signed)
Patient seen today for urinary tract infection symptoms.  Patient complains of dysuria.  Urinalysis done and positive for leukocytes and blood.  Will send for culture.  [facility]  Patient aware and educated about UTI.

## 2017-11-16 NOTE — Patient Instructions (Signed)

## 2019-07-26 IMAGING — US US MFM FETAL BPP W/O NON-STRESS
1 series · 14 of 28 positions shown · non-contrast
Comparison: none

[Series 1: us mfm fetal bpp w/o non-stress · 34 acquisitions, 14 frames shown]
[im 2/34]
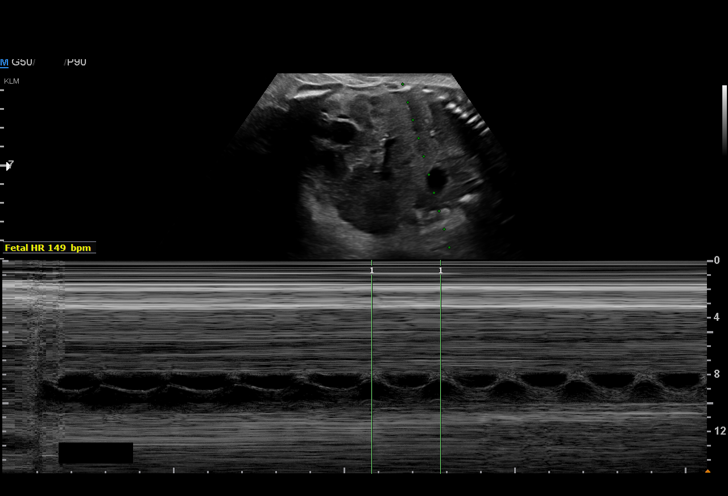
[im 4/34]
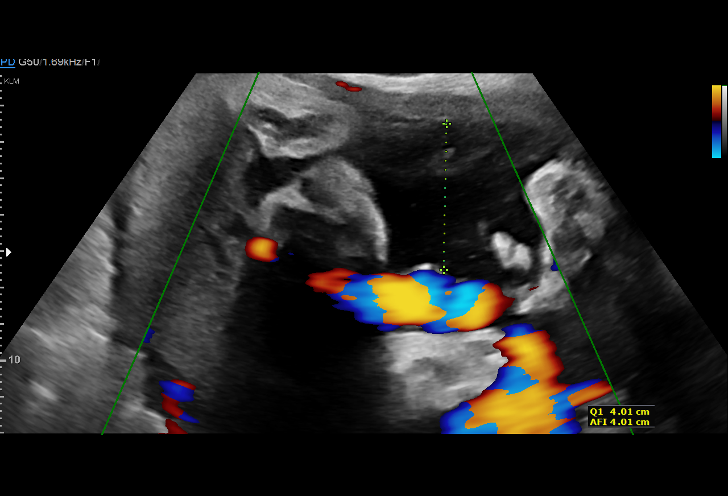
[im 7/34]
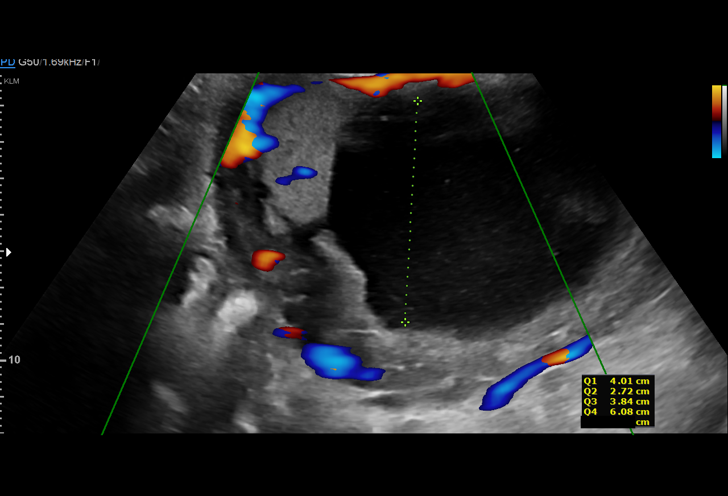
[im 9/34]
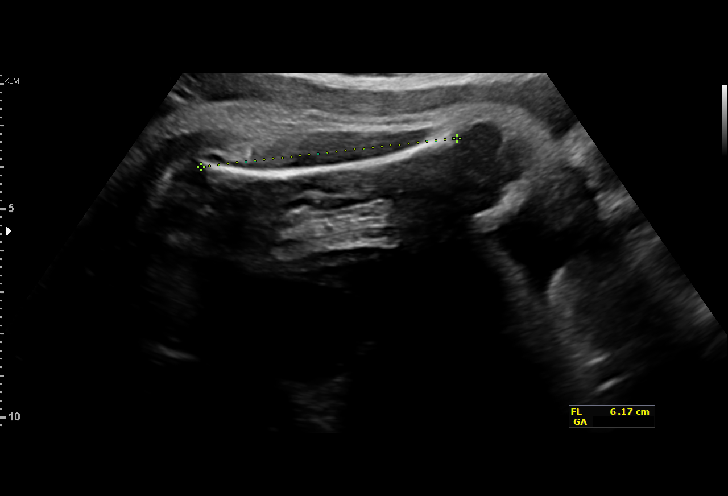
[im 12/34]
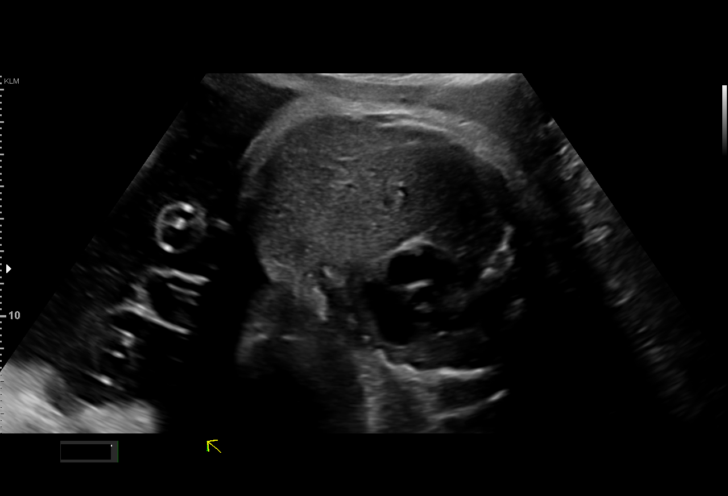
[im 14/34]
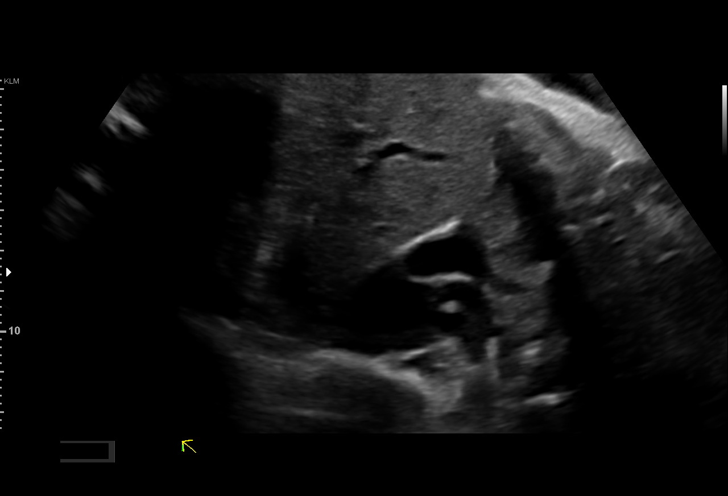
[im 16/34]
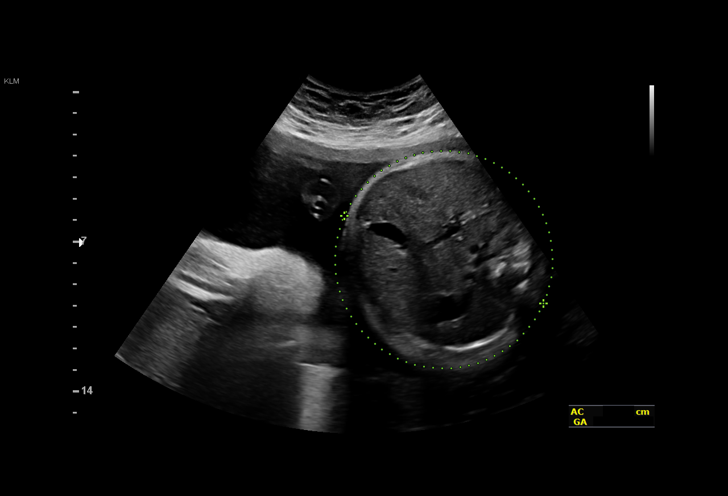
[im 19/34]
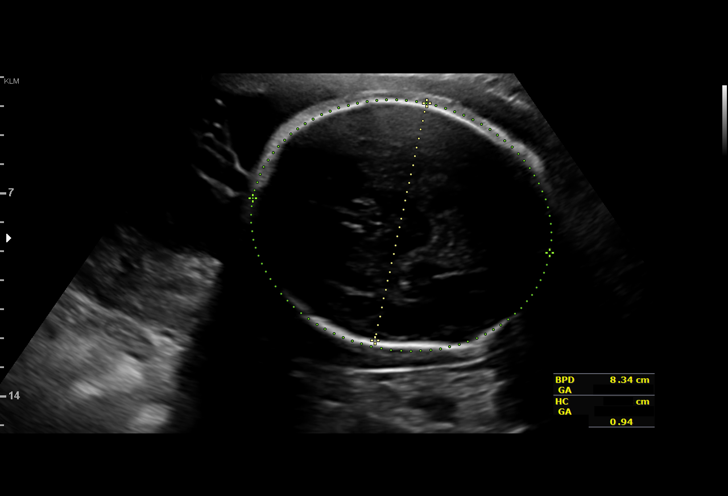
[im 21/34]
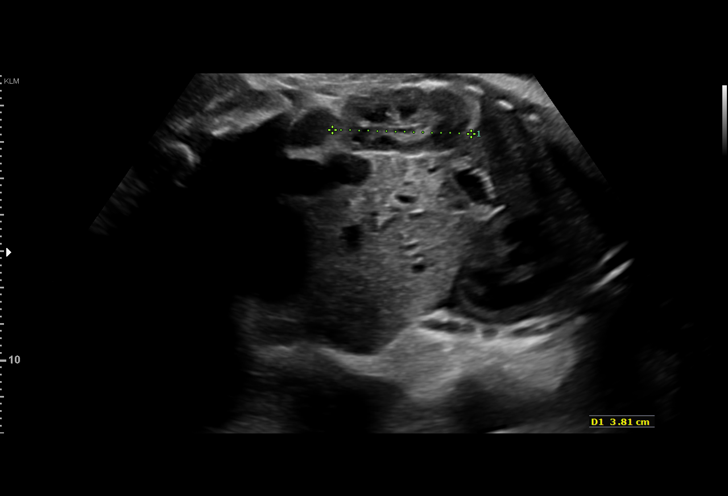
[im 24/34]
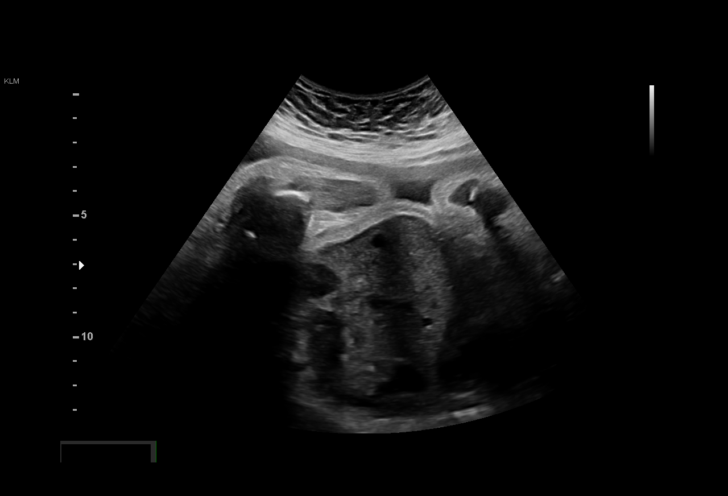
[im 26/34]
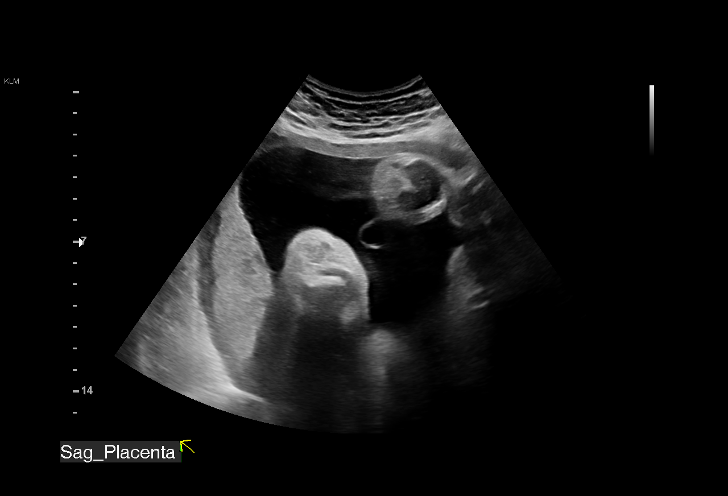
[im 29/34]
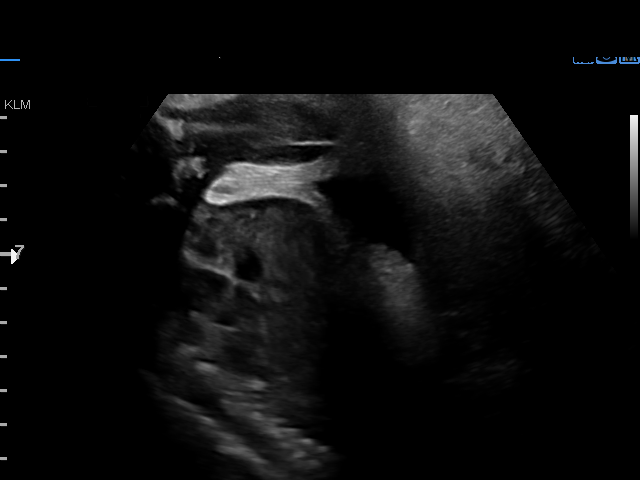
[im 31/34]
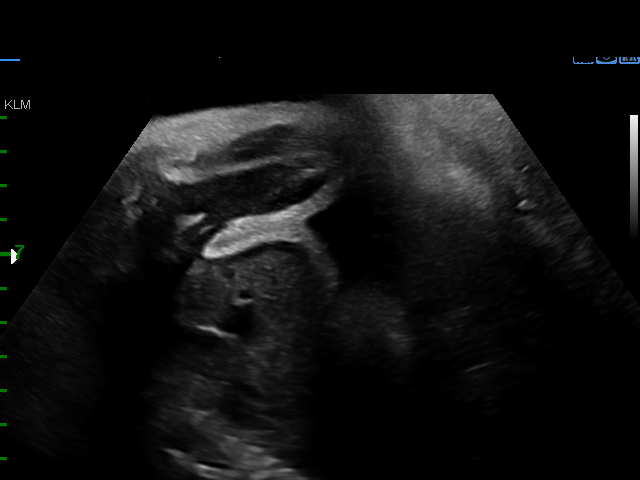
[im 34/34]
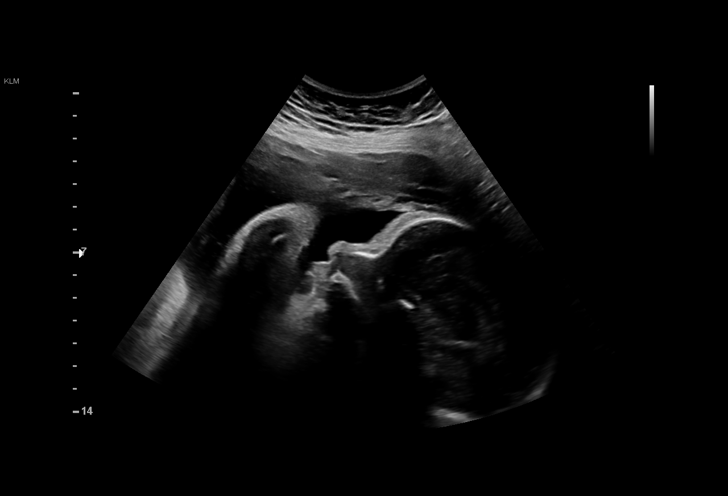

[14 of 28 positions shown; findings below may reference images not displayed]

OB/Gyn Clinic

1  UIVI BOOKMAL          [id number]      [id number]     [id number]
2  UIVI BOOKMAL          [id number]      [id number]     [id number]
Indications

32 weeks gestation of pregnancy
Gestational diabetes in pregnancy,
controlled by oral hypoglycemic drugs
(glyburide)
Obesity complicating pregnancy, third
trimester
OB History

Blood Type:            Height:  5'2"   Weight (lb):  205       BMI:
Gravidity:    2         Term:   1        Prem:   0        SAB:   0
TOP:          0       Ectopic:  0        Living: 1
Fetal Evaluation

Num Of Fetuses:     1
Fetal Heart         149
Rate(bpm):
Cardiac Activity:   Observed
Presentation:       Cephalic
Placenta:           Posterior, above cervical os
P. Cord Insertion:  Previously Visualized
Amniotic Fluid
AFI FV:      Subjectively within normal limits

AFI Sum(cm)     %Tile       Largest Pocket(cm)
16.65           60

RUQ(cm)       RLQ(cm)       LUQ(cm)        LLQ(cm)
4.01

Comment:    [DATE] BPP in 11 minutes.
Biophysical Evaluation

Amniotic F.V:   Within normal limits       F. Tone:        Observed
F. Movement:    Observed                   Score:          [DATE]
F. Breathing:   Observed
Biometry

BPD:      83.2  mm     G. Age:  33w 3d         73  %    CI:        76.31   %    70 - 86
FL/HC:      20.3   %    19.1 -
HC:      301.8  mm     G. Age:  33w 3d         41  %    HC/AC:      0.97        0.96 -
AC:      312.3  mm     G. Age:  35w 1d       > 97  %    FL/BPD:     73.8   %    71 - 87
FL:       61.4  mm     G. Age:  31w 6d         23  %    FL/AC:      19.7   %    20 - 24

Est. FW:    8670  gm      5 lb 1 oz     80  %
Gestational Age

LMP:           33w 3d        Date:  11/20/16                 EDD:   08/27/17
U/S Today:     33w 3d                                        EDD:   08/27/17
Best:          32w 3d     Det. By:  Early Ultrasound         EDD:   09/03/17
(01/06/17)
Anatomy

Cranium:               Appears normal         Aortic Arch:            Previously seen
Cavum:                 Previously seen        Ductal Arch:            Previously seen
Ventricles:            Previously seen        Diaphragm:              Appears normal
Choroid Plexus:        Previously seen        Stomach:                Appears normal, left
sided
Cerebellum:            Previously seen        Abdomen:                Appears normal
Posterior Fossa:       Previously seen        Abdominal Wall:         Previously seen
Nuchal Fold:           Previously seen        Cord Vessels:           Previously seen
Face:                  Orbits and profile     Kidneys:                Appear normal
previously seen
Lips:                  Previously seen        Bladder:                Appears normal
Thoracic:              Appears normal         Spine:                  Previously seen
Heart:                 Previously seen        Upper Extremities:      Previously seen
RVOT:                  Appears normal         Lower Extremities:      Previously seen
LVOT:                  Appears normal

Other:  Female gender. 5th digit previously visualized.
Cervix Uterus Adnexa

Cervix
Not visualized (advanced GA >88wks)
Impression

SIUP at 74w7d, 3U8UA
active fetus
EFW 80th%'le
no defects seen
AFI is normal
BPP [DATE]
Recommendations

Continue antenatal testing and fetal Irene Alicia Poblete.

## 2019-08-16 IMAGING — US US FETAL BPP W/ NON-STRESS
1 series · 13 of 13 positions shown · non-contrast
Comparison: none

[Series 1: us fetal bpp w/nonstress · 13 acquisitions, 13 frames shown]
[im 1/13]
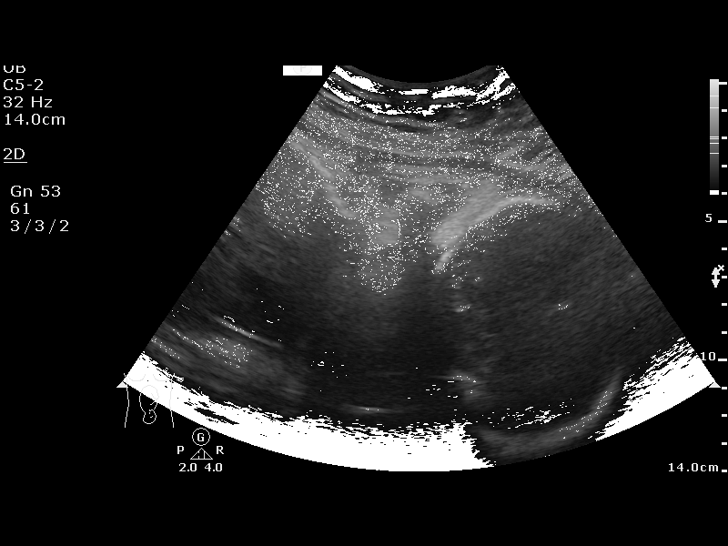
[im 2/13]
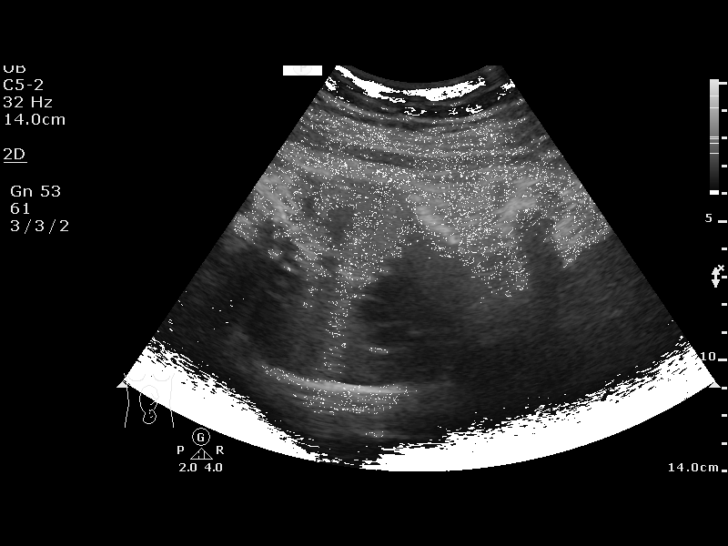
[im 3/13]
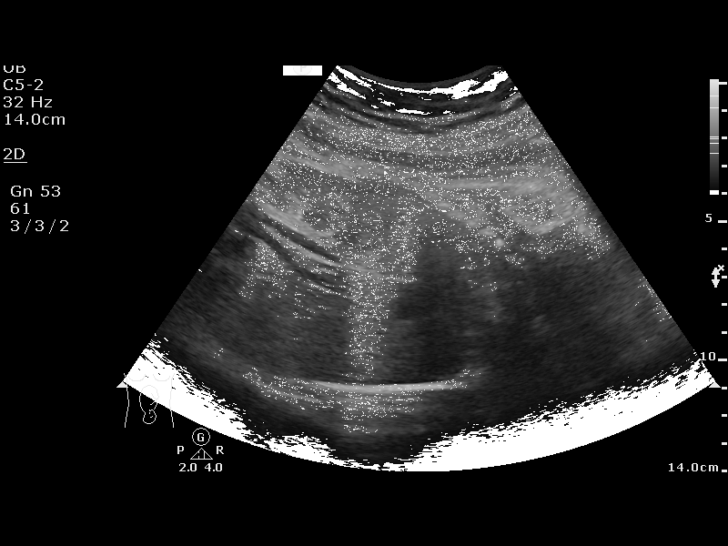
[im 4/13]
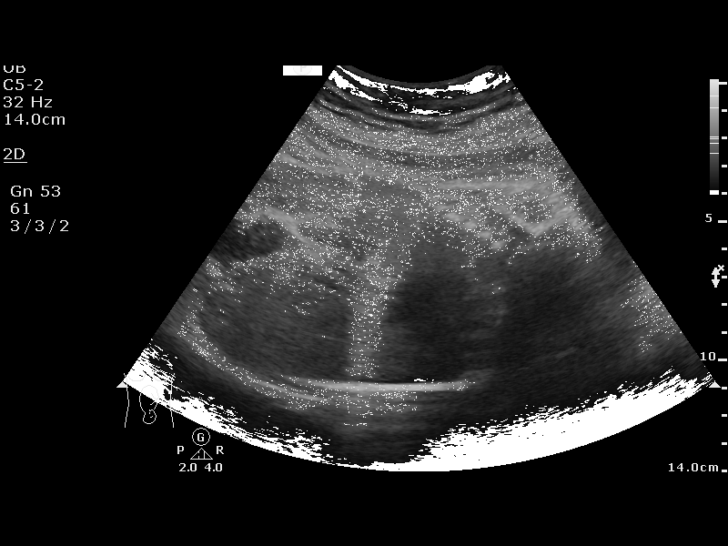
[im 5/13]
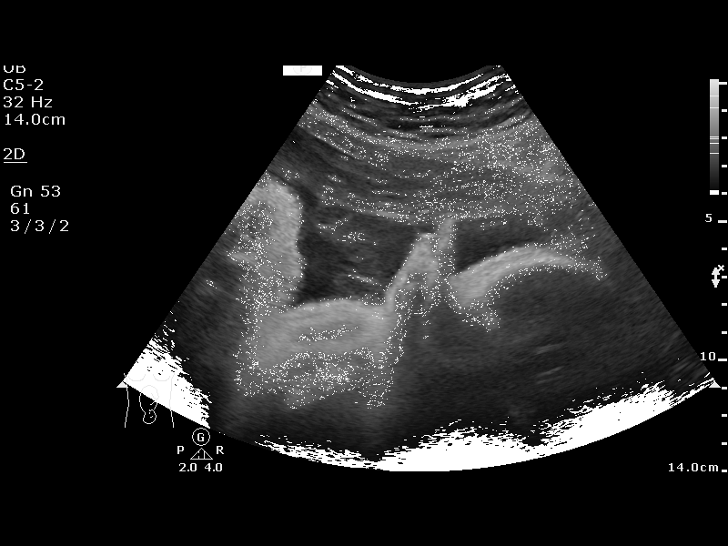
[im 6/13]
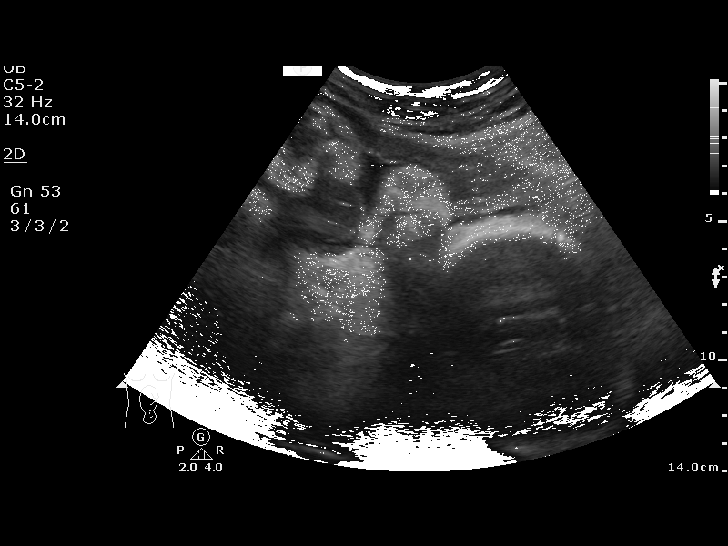
[im 7/13]
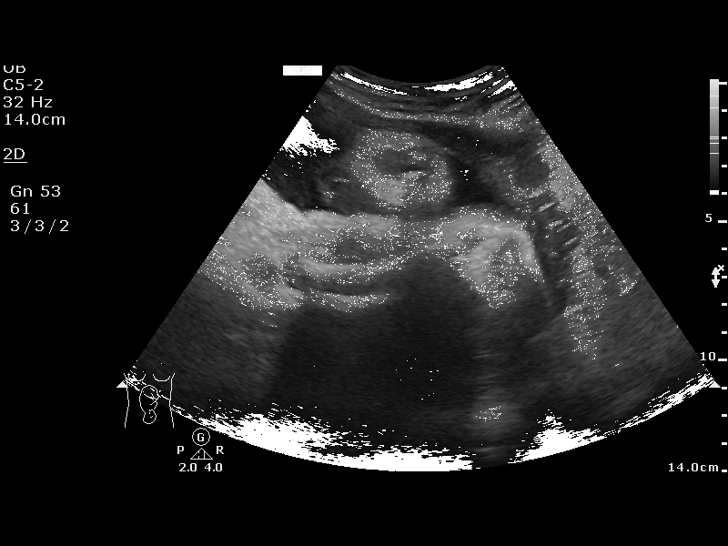
[im 8/13]
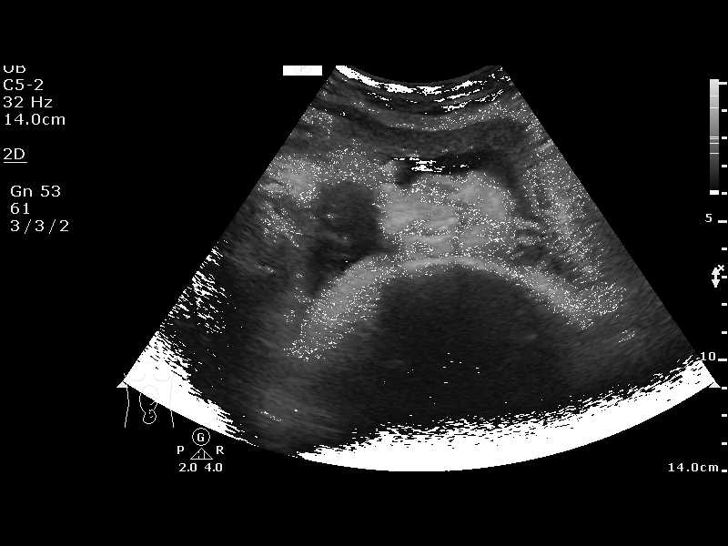
[im 9/13]
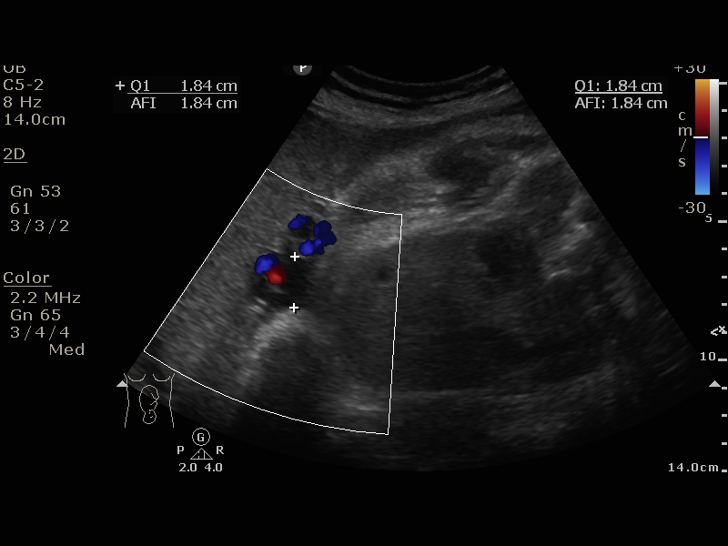
[im 10/13]
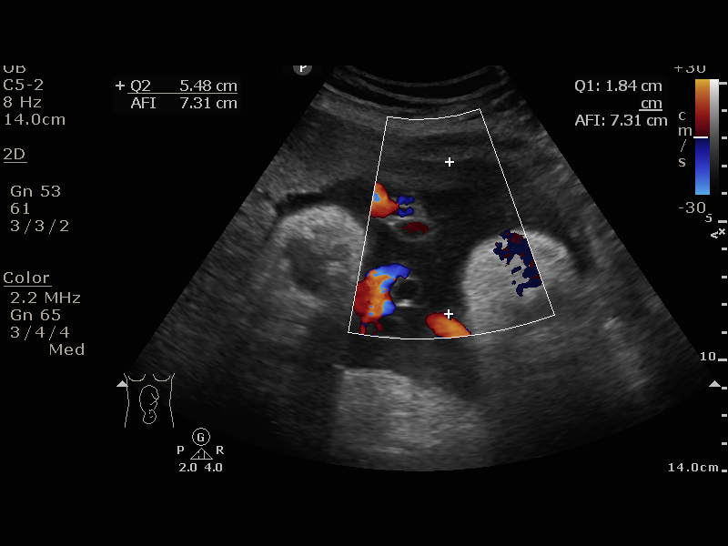
[im 11/13]
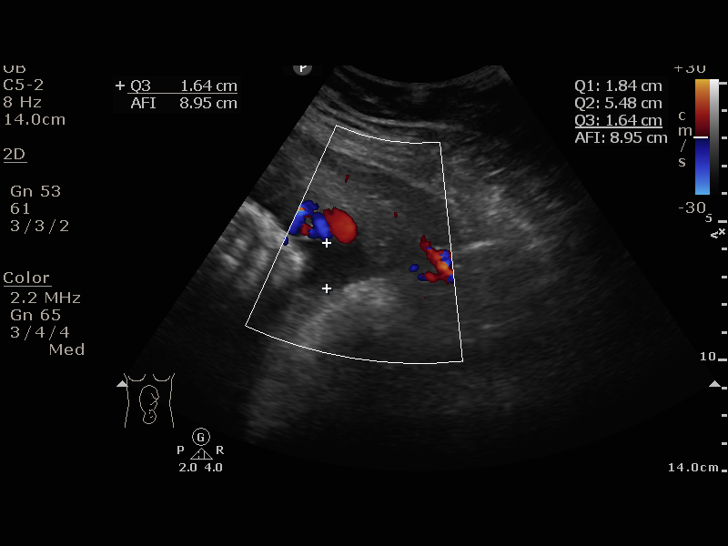
[im 12/13]
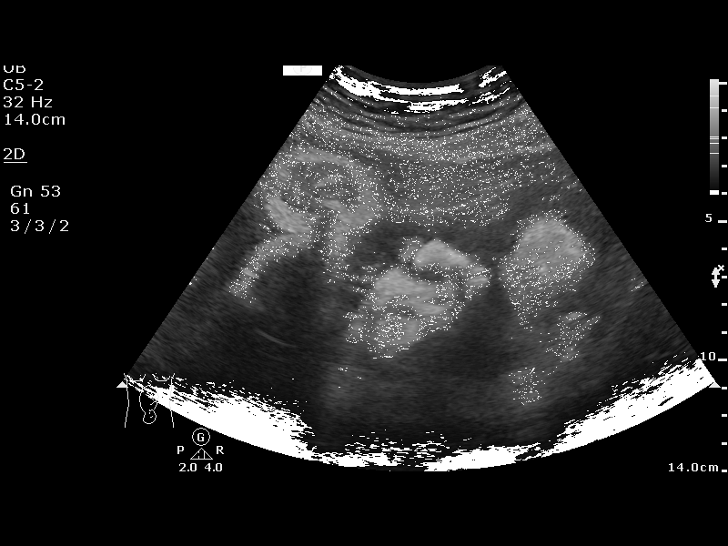
[im 13/13]
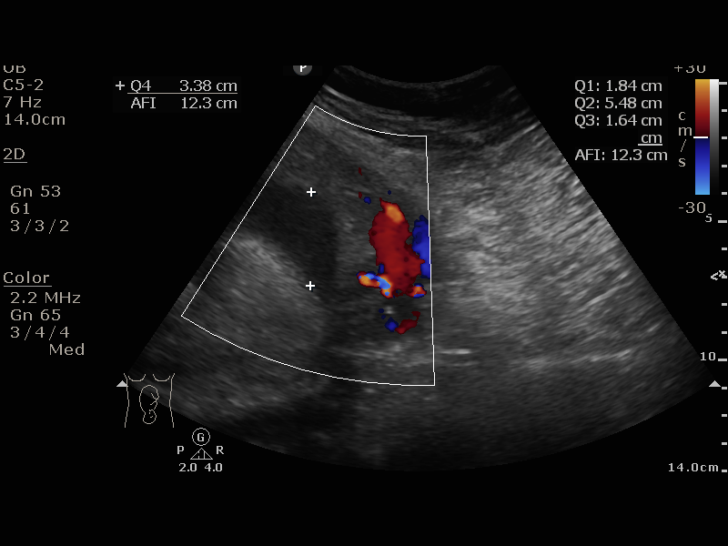

[13 of 13 positions shown; findings below may reference images not displayed]

OB/Gyn Clinic
Attending:        Kunaani Kayuhwa         [facility]         [facility]
[REDACTED]

1  US FETAL BPP W/NONSTRESS                    76818.4

1  SAILA LANDVIK          [id number]      [id number]     [id number]
Service(s) Provided

Indications

35 weeks gestation of pregnancy
Gestational diabetes in pregnancy,
controlled by oral hypoglycemic drugs
OB History

Blood Type:            Height:  5'2"   Weight (lb):  205       BMI:
Gravidity:    2         Term:   1        Prem:   0        SAB:   0
TOP:          0       Ectopic:  0        Living: 1
Fetal Evaluation

Num Of Fetuses:     1
Preg. Location:     Intrauterine
Cardiac Activity:   Observed
Presentation:       Cephalic

Amniotic Fluid
AFI FV:      Subjectively within normal limits

AFI Sum(cm)     %Tile       Largest Pocket(cm)
12.34           38
RUQ(cm)       RLQ(cm)       LUQ(cm)        LLQ(cm)
1.84
Biophysical Evaluation

Amniotic F.V:   Pocket => 2 cm two         F. Tone:        Observed
planes
F. Movement:    Observed                   N.S.T:          Reactive
F. Breathing:   Observed                   Score:          [DATE]
Gestational Age

LMP:           36w 3d        Date:  11/20/16                 EDD:   08/27/17
Best:          35w 3d     Det. By:  Early Ultrasound         EDD:   09/03/17
(01/06/17)
Impression

IUP at  06w0d with [id number]
Normal amniotic fluid volume
BPP[DATE]
Recommendations

Continue antenatal testing.
Recommend delivery by 39 weeks if maternal and fetal status
remain reassuring.

## 2019-09-03 ENCOUNTER — Encounter (HOSPITAL_COMMUNITY): Payer: Self-pay | Admitting: Emergency Medicine

## 2019-09-03 ENCOUNTER — Other Ambulatory Visit: Payer: Self-pay

## 2019-09-03 ENCOUNTER — Emergency Department (HOSPITAL_COMMUNITY)
Admission: EM | Admit: 2019-09-03 | Discharge: 2019-09-04 | Disposition: A | Discharge status: Home / Self Care | Payer: Medicaid Other | Attending: Emergency Medicine | Admitting: Emergency Medicine

## 2019-09-03 DIAGNOSIS — R110 Nausea: Secondary | ICD-10-CM | POA: Insufficient documentation

## 2019-09-03 DIAGNOSIS — R1013 Epigastric pain: Secondary | ICD-10-CM | POA: Insufficient documentation

## 2019-09-03 DIAGNOSIS — Z5321 Procedure and treatment not carried out due to patient leaving prior to being seen by health care provider: Secondary | ICD-10-CM | POA: Insufficient documentation

## 2019-09-03 DIAGNOSIS — M549 Dorsalgia, unspecified: Secondary | ICD-10-CM | POA: Insufficient documentation

## 2019-09-03 LAB — URINALYSIS, ROUTINE W REFLEX MICROSCOPIC
Bilirubin Urine: NEGATIVE
Glucose, UA: NEGATIVE mg/dL
Ketones, ur: NEGATIVE mg/dL
Nitrite: NEGATIVE
Protein, ur: NEGATIVE mg/dL
Specific Gravity, Urine: 1.023 (ref 1.005–1.030)
WBC, UA: >50 WBC/hpf — ABNORMAL HIGH (ref 0–5)
pH: 6 (ref 5.0–8.0)

## 2019-09-03 LAB — CBC
HCT: 44.6 % (ref 36.0–46.0)
Hemoglobin: 14.3 g/dL (ref 12.0–15.0)
MCH: 28.8 pg (ref 26.0–34.0)
MCHC: 32.1 g/dL (ref 30.0–36.0)
MCV: 89.9 fL (ref 80.0–100.0)
Platelets: 261 10*3/uL (ref 150–400)
RBC: 4.96 MIL/uL (ref 3.87–5.11)
RDW: 12.6 % (ref 11.5–15.5)
WBC: 7.7 10*3/uL (ref 4.0–10.5)
nRBC: 0 % (ref 0.0–0.2)

## 2019-09-03 LAB — I-STAT BETA HCG BLOOD, ED (MC, WL, AP ONLY): I-stat hCG, quantitative: <5 m[IU]/mL (ref <5)

## 2019-09-03 MED ORDER — SODIUM CHLORIDE 0.9% FLUSH: 3.0000 mL | Freq: Once | INTRAVENOUS | Status: DC

## 2019-09-03 NOTE — ED Triage Notes (Signed)
Patient reports epigastric abdominal pain and mid back pain onset this evening with mild nausea , no emesis or diarrhea , denies fever or chills .

## 2019-09-04 LAB — COMPREHENSIVE METABOLIC PANEL
ALT: 22 U/L (ref 0–44)
AST: 20 U/L (ref 15–41)
Albumin: 3.8 g/dL (ref 3.5–5.0)
Alkaline Phosphatase: 47 U/L (ref 38–126)
Anion gap: 11 (ref 5–15)
BUN: 10 mg/dL (ref 6–20)
CO2: 26 mmol/L (ref 22–32)
Calcium: 9.3 mg/dL (ref 8.9–10.3)
Chloride: 102 mmol/L (ref 98–111)
Creatinine, Ser: 0.72 mg/dL (ref 0.44–1.00)
GFR calc Af Amer: >60 mL/min (ref >60)
GFR calc non Af Amer: >60 mL/min (ref >60)
Glucose, Bld: 113 mg/dL — ABNORMAL HIGH (ref 70–99)
Potassium: 4.1 mmol/L (ref 3.5–5.1)
Sodium: 139 mmol/L (ref 135–145)
Total Bilirubin: 0.2 mg/dL — ABNORMAL LOW (ref 0.3–1.2)
Total Protein: 7.3 g/dL (ref 6.5–8.1)

## 2019-09-04 LAB — LIPASE, BLOOD: Lipase: 30 U/L (ref 11–51)

## 2019-09-04 NOTE — ED Notes (Signed)
PATIENT  STATES SHE HAS BEEN WAITING TO LONG,SHE LEFT WITHOUT BEING SEEN

## 2020-02-17 ENCOUNTER — Other Ambulatory Visit: Payer: Self-pay

## 2020-02-17 ENCOUNTER — Encounter (HOSPITAL_COMMUNITY): Payer: Self-pay | Admitting: Emergency Medicine

## 2020-02-17 ENCOUNTER — Emergency Department (HOSPITAL_COMMUNITY)
Admission: EM | Admit: 2020-02-17 | Discharge: 2020-02-17 | Disposition: A | Discharge status: Home / Self Care | Payer: Medicaid Other | Attending: Emergency Medicine | Admitting: Emergency Medicine

## 2020-02-17 DIAGNOSIS — Z79899 Other long term (current) drug therapy: Secondary | ICD-10-CM | POA: Insufficient documentation

## 2020-02-17 DIAGNOSIS — R1010 Upper abdominal pain, unspecified: Secondary | ICD-10-CM

## 2020-02-17 LAB — URINALYSIS, ROUTINE W REFLEX MICROSCOPIC
Bilirubin Urine: NEGATIVE
Glucose, UA: NEGATIVE mg/dL
Ketones, ur: NEGATIVE mg/dL
Nitrite: NEGATIVE
Protein, ur: NEGATIVE mg/dL
Specific Gravity, Urine: 1.025 (ref 1.005–1.030)
pH: 5 (ref 5.0–8.0)

## 2020-02-17 LAB — CBC
HCT: 42.4 % (ref 36.0–46.0)
Hemoglobin: 13.6 g/dL (ref 12.0–15.0)
MCH: 28.5 pg (ref 26.0–34.0)
MCHC: 32.1 g/dL (ref 30.0–36.0)
MCV: 88.9 fL (ref 80.0–100.0)
Platelets: 258 10*3/uL (ref 150–400)
RBC: 4.77 MIL/uL (ref 3.87–5.11)
RDW: 12.1 % (ref 11.5–15.5)
WBC: 10.2 10*3/uL (ref 4.0–10.5)
nRBC: 0 % (ref 0.0–0.2)

## 2020-02-17 LAB — COMPREHENSIVE METABOLIC PANEL
ALT: 20 U/L (ref 0–44)
AST: 18 U/L (ref 15–41)
Albumin: 4 g/dL (ref 3.5–5.0)
Alkaline Phosphatase: 49 U/L (ref 38–126)
Anion gap: 11 (ref 5–15)
BUN: 11 mg/dL (ref 6–20)
CO2: 24 mmol/L (ref 22–32)
Calcium: 9.6 mg/dL (ref 8.9–10.3)
Chloride: 101 mmol/L (ref 98–111)
Creatinine, Ser: 0.65 mg/dL (ref 0.44–1.00)
GFR calc Af Amer: >60 mL/min (ref >60)
GFR calc non Af Amer: >60 mL/min (ref >60)
Glucose, Bld: 102 mg/dL — ABNORMAL HIGH (ref 70–99)
Potassium: 4.2 mmol/L (ref 3.5–5.1)
Sodium: 136 mmol/L (ref 135–145)
Total Bilirubin: 0.5 mg/dL (ref 0.3–1.2)
Total Protein: 7.6 g/dL (ref 6.5–8.1)

## 2020-02-17 LAB — I-STAT BETA HCG BLOOD, ED (MC, WL, AP ONLY): I-stat hCG, quantitative: <5 m[IU]/mL (ref <5)

## 2020-02-17 LAB — LIPASE, BLOOD: Lipase: 28 U/L (ref 11–51)

## 2020-02-17 MED ORDER — DICYCLOMINE HCL 10 MG PO CAPS
20.0000 mg | ORAL_CAPSULE | Freq: Once | ORAL | Status: AC
  Administered 2020-02-17: 20 mg via ORAL
  Filled 2020-02-17: qty 2

## 2020-02-17 MED ORDER — ACETAMINOPHEN 500 MG PO TABS
1000.0000 mg | ORAL_TABLET | Freq: Once | ORAL | Status: AC
  Administered 2020-02-17: 1000 mg via ORAL
  Filled 2020-02-17: qty 2

## 2020-02-17 MED ORDER — SUCRALFATE 1 G PO TABS
1.0000 g | ORAL_TABLET | Freq: Once | ORAL | Status: AC
  Administered 2020-02-17: 1 g via ORAL
  Filled 2020-02-17: qty 1

## 2020-02-17 MED ORDER — SUCRALFATE 1 G PO TABS: 1.0000 g | ORAL_TABLET | Freq: Three times a day (TID) | ORAL | 0 refills | Status: DC

## 2020-02-17 MED ORDER — DICYCLOMINE HCL 20 MG PO TABS: 20.0000 mg | ORAL_TABLET | Freq: Two times a day (BID) | ORAL | 0 refills | Status: DC

## 2020-02-17 MED ORDER — ONDANSETRON 4 MG PO TBDP: 4.0000 mg | ORAL_TABLET | Freq: Three times a day (TID) | ORAL | 0 refills | Status: AC | PRN

## 2020-02-17 NOTE — ED Triage Notes (Signed)
Pt reports upper middle quadrant pain X1 week described as constant sharp pain.  No other symptoms noted at this time.

## 2020-02-17 NOTE — Discharge Instructions (Addendum)
I have written you prescription for Bentyl which will help with cramping pain and Carafate/sucralfate which will likely help as well.  I also given you a prescription for Zofran in case you have any nausea you do not otherwise need to fill this.  You may follow-up with your primary care doctor as well as the gastroenterologist.  If you have a primary care doctor I strongly recommend follow-up with [facility].  I have attached information below. Please call to make an appointment with the [facility]/your primary care doctor as well as make an appointment with the gastroenterologist.  Please minimize how much ibuprofen, aleve, meloxicam you use and primarily use tylenol 1000mg  every 6 hours as needed for pain.

## 2020-02-17 NOTE — ED Provider Notes (Signed)
[facility] EMERGENCY DEPARTMENT Provider Note   CSN: [id number] Arrival date & time: 02/17/20  0118     History Chief Complaint  Patient presents with  . Abdominal Pain    Brittany Kerr is a 29 y.o. female.  HPI  Patient is a 30 year old female with no pertinent past medical history apart from history of anemia and was acid reflux presented today with epigastric pain that is sharp intermittent seems to be worse after eating.  She denies any radiation, denies any nausea vomiting lower abdominal pain, diarrhea, fevers, chills, vaginal discharge, dysuria, frequency urgency.  She denies any other associate symptoms.  No specific aggravating or mitigating factors.  She has had another episode in March that felt similar.  She states this resolved without intervention.  She states this time she has had pain for approximately 1 week but states that it is intermittent.  Was gone for several hours yesterday.  She denies any radiation of pain and states that her pain is currently 5/10.  She has taken some Tylenol without significant relief.  She is on omeprazole for her reflux.  She denies any dark or tarry stools.  Has had regular bowel movements and is passing gas.     Past Medical History:  Diagnosis Date  . Intrauterine device (IUD) contraceptive failure resulting in pregnancy 01/2017   paragard removed at 5wks. pregnancy continued normally  . Trichomoniasis of vagina 04/05/2017   Needs TOC end of November    Patient Active Problem List   Diagnosis Date Noted  . Gestational diabetes mellitus (GDM) controlled on oral hypoglycemic drug 08/27/2017  . SVD (spontaneous vaginal delivery) 08/27/2017  . GDM, class A2 06/07/2017  . Anemia 04/06/2017  . IUD pregnancy 03/07/2017  . Supervision of high-risk pregnancy 03/02/2017  . Cervical dysplasia 03/02/2017    History reviewed. No pertinent surgical history.   OB History    Gravida  2   Para  2   Term  2    Preterm  0   AB  0   Living  2     SAB  0   TAB  0   Ectopic  0   Multiple  0   Live Births  1           Family History  Problem Relation Age of Onset  . Diabetes Mother     Social History   Tobacco Use  . Smoking status: Never Smoker  . Smokeless tobacco: Never Used  Substance Use Topics  . Alcohol use: No  . Drug use: No    Home Medications Prior to Admission medications   Medication Sig Start Date End Date Taking? Authorizing Provider  Multiple Vitamin (MULTIVITAMINS PO) Take 1 tablet by mouth daily.    Yes [provider]  omeprazole (PRILOSEC) 20 MG capsule Take 20 mg by mouth daily as needed (Reflux).    Yes [provider]  ranitidine (ZANTAC) 150 MG tablet Take 1 tablet (150 mg total) by mouth 2 (two) times daily. 07/19/17  Yes Anyanwu, Jethro Bastos, MD  dicyclomine (BENTYL) 20 MG tablet Take 1 tablet (20 mg total) by mouth 2 (two) times daily. 02/17/20   Gailen Shelter, PA  ondansetron (ZOFRAN ODT) 4 MG disintegrating tablet Take 1 tablet (4 mg total) by mouth every 8 (eight) hours as needed for nausea or vomiting. 02/17/20   Kaylise Blakeley, Rodrigo Ran, PA  sucralfate (CARAFATE) 1 g tablet Take 1 tablet (1 g total) by mouth 4 (  four) times daily -  with meals and at bedtime for 14 days. 02/17/20 03/02/20  Gailen Shelter, PA    Allergies    Patient has no known allergies.  Review of Systems   Review of Systems  Constitutional: Negative for chills and fever.  HENT: Negative for congestion.   Eyes: Negative for pain.  Respiratory: Negative for cough and shortness of breath.   Cardiovascular: Negative for chest pain and leg swelling.  Gastrointestinal: Positive for abdominal pain. Negative for diarrhea, nausea and vomiting.  Genitourinary: Negative for dysuria.  Musculoskeletal: Negative for myalgias.  Skin: Negative for rash.  Neurological: Negative for dizziness and headaches.    Physical Exam Updated Vital Signs BP 106/72   Pulse 66    Temp 98.1 F (36.7 C) (Oral)   Resp 17   Ht 5\' 2"  (1.575 m)   Wt 91.6 kg   SpO2 100%   BMI 36.95 kg/m   Physical Exam Vitals and nursing note reviewed.  Constitutional:      General: She is not in acute distress.    Comments: Pleasant well-appearing 29 year old female no acute distress sitting comfortably in bed.  HENT:     Head: Normocephalic and atraumatic.     Nose: Nose normal.  Eyes:     General: No scleral icterus. Cardiovascular:     Rate and Rhythm: Normal rate and regular rhythm.     Pulses: Normal pulses.     Heart sounds: Normal heart sounds.  Pulmonary:     Effort: Pulmonary effort is normal. No respiratory distress.     Breath sounds: No wheezing.  Abdominal:     Palpations: Abdomen is soft.     Tenderness: There is no abdominal tenderness. There is no right CVA tenderness, left CVA tenderness, guarding or rebound. Negative signs include Murphy's sign, Rovsing's sign and McBurney's sign.     Comments: Negative Murphy sign.  Musculoskeletal:     Cervical back: Normal range of motion.     Right lower leg: No edema.     Left lower leg: No edema.  Skin:    General: Skin is warm and dry.     Capillary Refill: Capillary refill takes less than 2 seconds.  Neurological:     Mental Status: She is alert. Mental status is at baseline.  Psychiatric:        Mood and Affect: Mood normal.        Behavior: Behavior normal.     ED Results / Procedures / Treatments   Labs (all labs ordered are listed, but only abnormal results are displayed) Labs Reviewed  COMPREHENSIVE METABOLIC PANEL - Abnormal; Notable for the following components:      Result Value   Glucose, Bld 102 (*)    All other components within normal limits  LIPASE, BLOOD  CBC  URINALYSIS, ROUTINE W REFLEX MICROSCOPIC  I-STAT BETA HCG BLOOD, ED ([facility], [facility], [facility] ONLY)    EKG EKG Interpretation  Date/Time:  Monday February 17 2020 08:21:41 EDT Ventricular Rate:  68 PR Interval:    QRS Duration: 96 QT  Interval:  409 QTC Calculation: 435 R Axis:   18 Text Interpretation: Sinus rhythm No prior ECG for comparison. No STEMI Confirmed by [birth year]1971 (Theda Belfast) on 02/17/2020 8:25:36 AM   Radiology No results found.  Procedures Procedures (including critical care time)  Medications Ordered in ED Medications  acetaminophen (TYLENOL) tablet 1,000 mg (has no administration in time range)  dicyclomine (BENTYL) capsule 20 mg (has no administration  in time range)  sucralfate (CARAFATE) tablet 1 g (has no administration in time range)    ED Course  I have reviewed the triage vital signs and the nursing notes.  Pertinent labs & imaging results that were available during my care of the patient were reviewed by me and considered in my medical decision making (see chart for details).  Clinical Course as of Feb 17 924  Mon Feb 17, 2020  0908 EKG NSR with no ST-T wave abn. No comparison. Reassuring EKG.    [WF]  0917 Glucose(!): 102 [WF]    Clinical Course User Index [WF] Gailen Shelter, [place]   MDM Rules/Calculators/A&P                          Patient is 29 year old female with no significant past medical history apart from acid reflux presented for a gastric abdominal pain that is sharp on arrival and was moderate currently.  She denies any chest pain or shortness of breath denies any other associated symptoms.  Patient's physical exam is unremarkable she has no abdominal tenderness but is pointing to her epigastrium when she discusses the pain.  CMP without abnormality. CBC without leukocytosis or anemia. Lipase within normal limits doubt pancreatitis. I-STAT hCG is negative ectopic pregnancy. EKG reviewed.  Reassuring.  The causes of generalized abdominal pain include but are not limited to AAA, mesenteric ischemia, appendicitis, diverticulitis, DKA, gastritis, gastroenteritis, AMI, nephrolithiasis, pancreatitis, peritonitis, adrenal insufficiency,lead poisoning, iron toxicity,  intestinal ischemia, constipation, UTI,SBO/LBO, splenic rupture, biliary disease, IBD, IBS, PUD, or hepatitis. Patient's abdominal pain is upper and she has no abnormal vital signs no tachycardia no significant distress and has no lower abdominal pain very much doubt ectopic pregnancy, ovarian torsion, PID.  Patient overall well-appearing.  Will provide patient with Tylenol, Bentyl, Zofran, sucralfate recommendations and discharge.  Patient is tolerating p.o. at this time states her pain is minimal and is agreeable to plan.  She will follow up with gastroenterology.  She is given return precautions.  Final Clinical Impression(s) / ED Diagnoses Final diagnoses:  Pain of upper abdomen    Rx / DC Orders ED Discharge Orders         Ordered    dicyclomine (BENTYL) 20 MG tablet  2 times daily        02/17/20 0920    ondansetron (ZOFRAN ODT) 4 MG disintegrating tablet  Every 8 hours PRN        02/17/20 0920    sucralfate (CARAFATE) 1 g tablet  3 times daily with meals & bedtime        02/17/20 0920           Gailen Shelter, [place] 02/17/20 2423    Tegeler, Canary Brim, MD 02/17/20 [id number]

## 2020-04-09 ENCOUNTER — Other Ambulatory Visit: Payer: Self-pay | Admitting: Gastroenterology

## 2020-04-09 DIAGNOSIS — R1013 Epigastric pain: Secondary | ICD-10-CM

## 2020-04-17 ENCOUNTER — Ambulatory Visit
Admission: RE | Admit: 2020-04-17 | Discharge: 2020-04-17 | Disposition: A | Discharge status: Home / Self Care | Payer: Medicaid Other | Source: Ambulatory Visit | Attending: Gastroenterology | Admitting: Gastroenterology

## 2020-04-17 DIAGNOSIS — R1013 Epigastric pain: Secondary | ICD-10-CM

## 2020-07-03 ENCOUNTER — Ambulatory Visit: Payer: Self-pay | Admitting: Surgery

## 2020-07-03 NOTE — H&P (Signed)
History of Present Illness Brittany Kerr. Brittany Moga MD; 07/03/2020 11:40 AM) The patient is a 30 year old female who presents for evaluation of gall stones. Referred by Dr. Marca Ancona for chronic cholecystitis  This is a 30 year old female who presents with a one-year history of intermittent epigastric pain radiating through to her back associated with nausea, diarrhea, bloating. This tends to occur after eating. She has had 2 severe episodes that resulted in an assisted emergency department. Lab tests were normal. She has had significant reflux over since the birth of her last child who is now 62 years old. She was referred to GI for evaluation. An ultrasound was obtained which showed a 19 mm gallstone with in a otherwise normal-appearing gallbladder. She is referred to [place] for cholecystectomy. She has been changing her diet. She continues to have mild symptoms.  CLINICAL DATA: Epigastric pain for several months  EXAM: ULTRASOUND ABDOMEN LIMITED RIGHT UPPER QUADRANT  COMPARISON: None.  FINDINGS: Gallbladder:  Gallbladder is well distended with the 19 mm stone within. No wall thickening or pericholecystic fluid is noted.  Common bile duct:  Diameter: 3 mm  Liver:  Diffusely increased in echogenicity consistent with fatty infiltration. Portal vein is patent on color Doppler imaging with normal direction of blood flow towards the liver.  Other: None.  IMPRESSION: Cholelithiasis without complicating factors.  Fatty liver.   Electronically Signed By: Alcide Clever M.D. On: 04/17/2020 16:03   Problem List/Past Medical Brittany Hazard K. Brandley Aldrete, MD; 07/03/2020 11:41 AM) CHRONIC CHOLECYSTITIS WITH CALCULUS (K80.10)  Past Surgical History Brittany Coots, CNA; 07/03/2020 10:46 AM) No pertinent past surgical history  Diagnostic Studies History Brittany Coots, CNA; 07/03/2020 10:46 AM) Colonoscopy never Mammogram never  Allergies Brittany Coots, CNA; 07/03/2020 10:47 AM) No  Known Drug Allergies [07/03/2020]: Allergies Reconciled  Medication History Brittany Coots, CNA; 07/03/2020 10:47 AM) Omeprazole (10MG  Capsule DR, Oral) Active. 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 Tobacco use Never smoker.  Family History 07/05/2020, CNA; 07/03/2020 10:46 AM) Arthritis Mother. Diabetes Mellitus Mother.  Pregnancy / Birth History 07/05/2020, CNA; 07/03/2020 10:46 AM) Age at menarche 15 years. Contraceptive History Intrauterine device. Gravida 2 Maternal age 23-25 Regular periods  Other Problems 05-17-1971. Ichael Pullara, MD; 07/03/2020 11:41 AM) Cholelithiasis Gastroesophageal Reflux Disease     Review of Systems [facility] CNA; 07/03/2020 10:46 AM) General Present- Fatigue and Weight Gain. Not Present- Appetite Loss, Chills, Fever, Night Sweats and Weight Loss. Skin Not Present- Change in Wart/Mole, Dryness, Hives, Jaundice, New Lesions, Non-Healing Wounds, Rash and Ulcer. HEENT Not Present- Earache, Hearing Loss, Hoarseness, Nose Bleed, Oral Ulcers, Ringing in the Ears, Seasonal Allergies, Sinus Pain, Sore Throat, Visual Disturbances, Wears glasses/contact lenses and Yellow Eyes. Gastrointestinal Present- Abdominal Pain, Change in Bowel Habits, Indigestion and Nausea. Not Present- Bloating, Bloody Stool, Chronic diarrhea, Constipation, Difficulty Swallowing, Excessive gas, Gets full quickly at meals, Hemorrhoids, Rectal Pain and Vomiting. Endocrine Not Present- Cold Intolerance, Excessive Hunger, Hair Changes, Heat Intolerance, Hot flashes and New Diabetes. Hematology Not Present- Blood Thinners, Easy Bruising, Excessive bleeding, Gland problems, HIV and Persistent Infections.  Vitals 07/05/2020 [place] CNA; 07/03/2020 10:48 AM) 07/03/2020 10:47 AM Weight: 208.5 lb Temp.: 83F  Pulse: 100 (Regular)  P.OX: 96% (Room air) BP: 110/70(Sitting, Left Arm,  Standard)        Physical Exam 07/05/2020 K. Clive Parcel MD; 07/03/2020 11:41 AM)  The physical exam findings are as follows: Note:Constitutional: WDWN in NAD, conversant, no obvious deformities; resting comfortably Eyes:  Pupils equal, round; sclera anicteric; moist conjunctiva; no lid lag HENT: Oral mucosa moist; good dentition Neck: No masses palpated, trachea midline; no thyromegaly Lungs: CTA bilaterally; normal respiratory effort CV: Regular rate and rhythm; no murmurs; extremities well-perfused with no edema Abd: +bowel sounds, soft, minimal RUQ tenderness, no palpable organomegaly; no palpable hernias Musc: Normal gait; no apparent clubbing or cyanosis in extremities Lymphatic: No palpable cervical or axillary lymphadenopathy Skin: Warm, dry; no sign of jaundice Psychiatric - alert and oriented x 4; calm mood and affect    Assessment & Plan Brittany Hazard K. Princes Finger MD; 07/03/2020 11:07 AM)  CHRONIC CHOLECYSTITIS WITH CALCULUS (K80.10)  Current Plans Schedule for Surgery - Laparoscopic cholecystectomy with intraoperative cholangiogram. The surgical procedure has been discussed with the patient. Potential risks, benefits, alternative treatments, and expected outcomes have been explained. All of the patient's questions at this time have been answered. The likelihood of reaching the patient's treatment goal is good. The patient understand the proposed surgical procedure and wishes to proceed.  Brittany Kerr. Corliss Skains, MD, [facility]  General/ Trauma Surgery   07/03/2020 11:42 AM

## 2020-08-13 ENCOUNTER — Other Ambulatory Visit: Payer: Self-pay

## 2020-08-13 ENCOUNTER — Encounter (HOSPITAL_BASED_OUTPATIENT_CLINIC_OR_DEPARTMENT_OTHER): Payer: Self-pay | Admitting: Surgery

## 2020-08-15 ENCOUNTER — Other Ambulatory Visit (HOSPITAL_COMMUNITY)
Admission: RE | Admit: 2020-08-15 | Discharge: 2020-08-15 | Disposition: A | Discharge status: Home / Self Care | Payer: Medicaid Other | Source: Ambulatory Visit | Attending: Surgery | Admitting: Surgery

## 2020-08-15 DIAGNOSIS — Z01812 Encounter for preprocedural laboratory examination: Secondary | ICD-10-CM | POA: Insufficient documentation

## 2020-08-15 DIAGNOSIS — Z20822 Contact with and (suspected) exposure to covid-19: Secondary | ICD-10-CM | POA: Insufficient documentation

## 2020-08-15 LAB — SARS CORONAVIRUS 2 (TAT 6-24 HRS): SARS Coronavirus 2: NEGATIVE

## 2020-08-17 ENCOUNTER — Other Ambulatory Visit (HOSPITAL_COMMUNITY): Payer: Medicaid Other

## 2020-08-17 NOTE — Progress Notes (Signed)
      Enhanced Recovery after Surgery for Orthopedics Enhanced Recovery after Surgery is a protocol used to improve the stress on your body and your recovery after surgery.  Patient Instructions  . The night before surgery:  o No food after midnight. ONLY clear liquids after midnight  . The day of surgery (if you do NOT have diabetes):  o Drink ONE (1) Pre-Surgery Clear Ensure as directed.   o This drink was given to you during your hospital  pre-op appointment visit. o The pre-op nurse will instruct you on the time to drink the  Pre-Surgery Ensure depending on your surgery time. o Finish the drink at the designated time by the pre-op nurse.  o Nothing else to drink after completing the  Pre-Surgery Clear Ensure.  . The day of surgery (if you have diabetes): o Drink ONE (1) Gatorade 2 (G2) as directed. o This drink was given to you during your hospital  pre-op appointment visit.  o The pre-op nurse will instruct you on the time to drink the   Gatorade 2 (G2) depending on your surgery time. o Color of the Gatorade may vary. Red is not allowed. o Nothing else to drink after completing the  Gatorade 2 (G2).         If you have questions, please contact your surgeon's office.  Surgical soap given to the pt and RN explained hor to use it.  Pt verbalized understandings.

## 2020-08-18 ENCOUNTER — Ambulatory Visit (HOSPITAL_BASED_OUTPATIENT_CLINIC_OR_DEPARTMENT_OTHER): Payer: Self-pay | Admitting: Anesthesiology

## 2020-08-18 ENCOUNTER — Ambulatory Visit (HOSPITAL_BASED_OUTPATIENT_CLINIC_OR_DEPARTMENT_OTHER)
Admission: RE | Admit: 2020-08-18 | Discharge: 2020-08-18 | Disposition: A | Discharge status: Home / Self Care | Payer: Self-pay | Attending: Surgery | Admitting: Surgery

## 2020-08-18 ENCOUNTER — Encounter (HOSPITAL_BASED_OUTPATIENT_CLINIC_OR_DEPARTMENT_OTHER)
Admission: RE | Disposition: A | Discharge status: Home / Self Care | Payer: Self-pay | Source: Home / Self Care | Attending: Surgery

## 2020-08-18 ENCOUNTER — Encounter (HOSPITAL_BASED_OUTPATIENT_CLINIC_OR_DEPARTMENT_OTHER): Payer: Self-pay | Admitting: Surgery

## 2020-08-18 ENCOUNTER — Ambulatory Visit (HOSPITAL_COMMUNITY): Payer: Self-pay

## 2020-08-18 ENCOUNTER — Other Ambulatory Visit: Payer: Self-pay

## 2020-08-18 DIAGNOSIS — K8010 Calculus of gallbladder with chronic cholecystitis without obstruction: Secondary | ICD-10-CM | POA: Insufficient documentation

## 2020-08-18 DIAGNOSIS — Z419 Encounter for procedure for purposes other than remedying health state, unspecified: Secondary | ICD-10-CM

## 2020-08-18 DIAGNOSIS — K760 Fatty (change of) liver, not elsewhere classified: Secondary | ICD-10-CM | POA: Insufficient documentation

## 2020-08-18 DIAGNOSIS — Z833 Family history of diabetes mellitus: Secondary | ICD-10-CM | POA: Insufficient documentation

## 2020-08-18 DIAGNOSIS — Z8261 Family history of arthritis: Secondary | ICD-10-CM | POA: Insufficient documentation

## 2020-08-18 HISTORY — DX: Calculus of gallbladder without cholecystitis without obstruction: K80.20

## 2020-08-18 HISTORY — PX: CHOLECYSTECTOMY: SHX55

## 2020-08-18 HISTORY — DX: Gastro-esophageal reflux disease without esophagitis: K21.9

## 2020-08-18 LAB — POCT PREGNANCY, URINE: Preg Test, Ur: NEGATIVE

## 2020-08-18 SURGERY — LAPAROSCOPIC CHOLECYSTECTOMY WITH INTRAOPERATIVE CHOLANGIOGRAM
Anesthesia: General | Site: Abdomen

## 2020-08-18 MED ORDER — GABAPENTIN 300 MG PO CAPS
ORAL_CAPSULE | ORAL | Status: AC
  Filled 2020-08-18: qty 1

## 2020-08-18 MED ORDER — OXYCODONE HCL 5 MG PO TABS
ORAL_TABLET | ORAL | Status: AC
  Filled 2020-08-18: qty 1

## 2020-08-18 MED ORDER — EPHEDRINE 5 MG/ML INJ
INTRAVENOUS | Status: AC
  Filled 2020-08-18: qty 10

## 2020-08-18 MED ORDER — ACETAMINOPHEN 10 MG/ML IV SOLN: 1000.0000 mg | Freq: Once | INTRAVENOUS | Status: DC | PRN

## 2020-08-18 MED ORDER — ACETAMINOPHEN 120 MG RE SUPP
RECTAL | Status: AC
  Filled 2020-08-18: qty 1

## 2020-08-18 MED ORDER — DEXAMETHASONE SODIUM PHOSPHATE 10 MG/ML IJ SOLN
INTRAMUSCULAR | Status: DC | PRN
  Administered 2020-08-18: 10 mg via INTRAVENOUS

## 2020-08-18 MED ORDER — ROCURONIUM BROMIDE 100 MG/10ML IV SOLN
INTRAVENOUS | Status: DC | PRN
  Administered 2020-08-18: 10 mg via INTRAVENOUS
  Administered 2020-08-18: 70 mg via INTRAVENOUS

## 2020-08-18 MED ORDER — LIDOCAINE 2% (20 MG/ML) 5 ML SYRINGE
INTRAMUSCULAR | Status: DC | PRN
  Administered 2020-08-18: 70 mg via INTRAVENOUS

## 2020-08-18 MED ORDER — ROCURONIUM BROMIDE 10 MG/ML (PF) SYRINGE
PREFILLED_SYRINGE | INTRAVENOUS | Status: AC
  Filled 2020-08-18: qty 10

## 2020-08-18 MED ORDER — KETOROLAC TROMETHAMINE 30 MG/ML IJ SOLN
INTRAMUSCULAR | Status: AC
  Filled 2020-08-18: qty 1

## 2020-08-18 MED ORDER — OXYCODONE HCL 5 MG PO TABS
5.0000 mg | ORAL_TABLET | Freq: Once | ORAL | Status: AC | PRN
  Administered 2020-08-18: 5 mg via ORAL

## 2020-08-18 MED ORDER — KETOROLAC TROMETHAMINE 30 MG/ML IJ SOLN
INTRAMUSCULAR | Status: DC | PRN
  Administered 2020-08-18: 30 mg via INTRAVENOUS

## 2020-08-18 MED ORDER — SODIUM CHLORIDE 0.9 % IR SOLN
Status: DC | PRN
  Administered 2020-08-18: 500 mL

## 2020-08-18 MED ORDER — ACETAMINOPHEN 500 MG PO TABS: 1000.0000 mg | ORAL_TABLET | Freq: Once | ORAL | Status: DC | PRN

## 2020-08-18 MED ORDER — ACETAMINOPHEN 160 MG/5ML PO SOLN: 1000.0000 mg | Freq: Once | ORAL | Status: DC | PRN

## 2020-08-18 MED ORDER — FENTANYL CITRATE (PF) 100 MCG/2ML IJ SOLN
INTRAMUSCULAR | Status: AC
  Filled 2020-08-18: qty 2

## 2020-08-18 MED ORDER — OXYCODONE HCL 5 MG/5ML PO SOLN: 5.0000 mg | Freq: Once | ORAL | Status: AC | PRN

## 2020-08-18 MED ORDER — AMISULPRIDE (ANTIEMETIC) 5 MG/2ML IV SOLN
INTRAVENOUS | Status: AC
  Filled 2020-08-18: qty 2

## 2020-08-18 MED ORDER — AMISULPRIDE (ANTIEMETIC) 5 MG/2ML IV SOLN
10.0000 mg | Freq: Once | INTRAVENOUS | Status: AC
  Administered 2020-08-18: 10 mg via INTRAVENOUS

## 2020-08-18 MED ORDER — ONDANSETRON HCL 4 MG/2ML IJ SOLN
INTRAMUSCULAR | Status: DC | PRN
  Administered 2020-08-18: 4 mg via INTRAVENOUS

## 2020-08-18 MED ORDER — LACTATED RINGERS IV SOLN: INTRAVENOUS | Status: DC

## 2020-08-18 MED ORDER — DEXAMETHASONE SODIUM PHOSPHATE 10 MG/ML IJ SOLN
INTRAMUSCULAR | Status: AC
  Filled 2020-08-18: qty 1

## 2020-08-18 MED ORDER — BUPIVACAINE-EPINEPHRINE 0.25% -1:200000 IJ SOLN
INTRAMUSCULAR | Status: DC | PRN
  Administered 2020-08-18: 10 mL

## 2020-08-18 MED ORDER — LIDOCAINE 2% (20 MG/ML) 5 ML SYRINGE
INTRAMUSCULAR | Status: AC
  Filled 2020-08-18: qty 5

## 2020-08-18 MED ORDER — MIDAZOLAM HCL 2 MG/2ML IJ SOLN
INTRAMUSCULAR | Status: AC
  Filled 2020-08-18: qty 2

## 2020-08-18 MED ORDER — SODIUM CHLORIDE 0.9 % IV SOLN
INTRAVENOUS | Status: DC | PRN
  Administered 2020-08-18: 6 mL

## 2020-08-18 MED ORDER — OXYCODONE HCL 5 MG PO TABS: 5.0000 mg | ORAL_TABLET | Freq: Four times a day (QID) | ORAL | 0 refills | Status: AC | PRN

## 2020-08-18 MED ORDER — FENTANYL CITRATE (PF) 100 MCG/2ML IJ SOLN
25.0000 ug | INTRAMUSCULAR | Status: DC | PRN
  Administered 2020-08-18 (×2): 50 ug via INTRAVENOUS

## 2020-08-18 MED ORDER — ACETAMINOPHEN 500 MG PO TABS
ORAL_TABLET | ORAL | Status: AC
  Filled 2020-08-18: qty 2

## 2020-08-18 MED ORDER — EPHEDRINE SULFATE 50 MG/ML IJ SOLN
INTRAMUSCULAR | Status: DC | PRN
  Administered 2020-08-18: 10 mg via INTRAVENOUS

## 2020-08-18 MED ORDER — SUGAMMADEX SODIUM 200 MG/2ML IV SOLN
INTRAVENOUS | Status: DC | PRN
  Administered 2020-08-18: 200 mg via INTRAVENOUS

## 2020-08-18 MED ORDER — FENTANYL CITRATE (PF) 100 MCG/2ML IJ SOLN
INTRAMUSCULAR | Status: DC | PRN
  Administered 2020-08-18: 50 ug via INTRAVENOUS
  Administered 2020-08-18: 100 ug via INTRAVENOUS
  Administered 2020-08-18: 50 ug via INTRAVENOUS

## 2020-08-18 MED ORDER — ACETAMINOPHEN 500 MG PO TABS
1000.0000 mg | ORAL_TABLET | ORAL | Status: AC
  Administered 2020-08-18: 1000 mg via ORAL

## 2020-08-18 MED ORDER — CHLORHEXIDINE GLUCONATE CLOTH 2 % EX PADS: 6.0000 | MEDICATED_PAD | Freq: Once | CUTANEOUS | Status: DC

## 2020-08-18 MED ORDER — CEFAZOLIN SODIUM-DEXTROSE 2-4 GM/100ML-% IV SOLN
2.0000 g | INTRAVENOUS | Status: AC
  Administered 2020-08-18: 2 g via INTRAVENOUS

## 2020-08-18 MED ORDER — PROPOFOL 500 MG/50ML IV EMUL
INTRAVENOUS | Status: AC
  Filled 2020-08-18: qty 50

## 2020-08-18 MED ORDER — CEFAZOLIN SODIUM-DEXTROSE 2-4 GM/100ML-% IV SOLN
INTRAVENOUS | Status: AC
  Filled 2020-08-18: qty 100

## 2020-08-18 MED ORDER — MIDAZOLAM HCL 5 MG/5ML IJ SOLN
INTRAMUSCULAR | Status: DC | PRN
  Administered 2020-08-18: 2 mg via INTRAVENOUS

## 2020-08-18 MED ORDER — PROPOFOL 10 MG/ML IV BOLUS
INTRAVENOUS | Status: DC | PRN
  Administered 2020-08-18: 160 mg via INTRAVENOUS

## 2020-08-18 MED ORDER — PROPOFOL 10 MG/ML IV BOLUS
INTRAVENOUS | Status: AC
  Filled 2020-08-18: qty 20

## 2020-08-18 MED ORDER — ONDANSETRON HCL 4 MG/2ML IJ SOLN
INTRAMUSCULAR | Status: AC
  Filled 2020-08-18: qty 2

## 2020-08-18 MED ORDER — BUPIVACAINE-EPINEPHRINE (PF) 0.25% -1:200000 IJ SOLN
INTRAMUSCULAR | Status: AC
  Filled 2020-08-18: qty 30

## 2020-08-18 MED ORDER — GABAPENTIN 300 MG PO CAPS
300.0000 mg | ORAL_CAPSULE | ORAL | Status: AC
  Administered 2020-08-18: 300 mg via ORAL

## 2020-08-18 SURGICAL SUPPLY — 42 items
APL PRP STRL LF DISP 70% ISPRP (MISCELLANEOUS) ×1
APL SKNCLS STERI-STRIP NONHPOA (GAUZE/BANDAGES/DRESSINGS) ×1
APPLIER CLIP ROT 10 11.4 M/L (STAPLE) ×2
APR CLP MED LRG 11.4X10 (STAPLE) ×1
BAG SPEC RTRVL LRG 6X4 10 (ENDOMECHANICALS) ×1
BENZOIN TINCTURE PRP APPL 2/3 (GAUZE/BANDAGES/DRESSINGS) ×2 IMPLANT
BLADE CLIPPER SURG (BLADE) IMPLANT
CHLORAPREP W/TINT 26 (MISCELLANEOUS) ×2 IMPLANT
CLIP APPLIE ROT 10 11.4 M/L (STAPLE) ×1 IMPLANT
COVER MAYO STAND STRL (DRAPES) ×2 IMPLANT
COVER WAND RF STERILE (DRAPES) IMPLANT
DECANTER SPIKE VIAL GLASS SM (MISCELLANEOUS) IMPLANT
DRAPE C-ARM 42X72 X-RAY (DRAPES) ×2 IMPLANT
DRSG TEGADERM 2-3/8X2-3/4 SM (GAUZE/BANDAGES/DRESSINGS) ×6 IMPLANT
DRSG TEGADERM 4X4.75 (GAUZE/BANDAGES/DRESSINGS) ×2 IMPLANT
ELECT REM PT RETURN 9FT ADLT (ELECTROSURGICAL) ×2
ELECTRODE REM PT RTRN 9FT ADLT (ELECTROSURGICAL) ×1 IMPLANT
GLOVE BIOGEL M 6.5 STRL (GLOVE) ×2 IMPLANT
GLOVE SURG ENC MOIS LTX SZ7 (GLOVE) ×2 IMPLANT
GLOVE SURG UNDER POLY LF SZ7 (GLOVE) ×4 IMPLANT
GLOVE SURG UNDER POLY LF SZ7.5 (GLOVE) ×2 IMPLANT
GOWN STRL REUS W/ TWL LRG LVL3 (GOWN DISPOSABLE) ×4 IMPLANT
GOWN STRL REUS W/TWL LRG LVL3 (GOWN DISPOSABLE) ×8
HEMOSTAT SNOW SURGICEL 2X4 (HEMOSTASIS) IMPLANT
NS IRRIG 1000ML POUR BTL (IV SOLUTION) ×2 IMPLANT
PACK BASIN DAY SURGERY FS (CUSTOM PROCEDURE TRAY) ×2 IMPLANT
POUCH SPECIMEN RETRIEVAL 10MM (ENDOMECHANICALS) ×2 IMPLANT
SCISSORS LAP 5X35 DISP (ENDOMECHANICALS) ×2 IMPLANT
SET CHOLANGIOGRAPH 5 50 .035 (SET/KITS/TRAYS/PACK) ×2 IMPLANT
SET IRRIG TUBING LAPAROSCOPIC (IRRIGATION / IRRIGATOR) ×2 IMPLANT
SET TUBE SMOKE EVAC HIGH FLOW (TUBING) ×2 IMPLANT
SLEEVE ENDOPATH XCEL 5M (ENDOMECHANICALS) ×2 IMPLANT
SLEEVE SCD COMPRESS KNEE MED (STOCKING) ×2 IMPLANT
SPONGE GAUZE 2X2 8PLY STRL LF (GAUZE/BANDAGES/DRESSINGS) ×2 IMPLANT
STRIP CLOSURE SKIN 1/2X4 (GAUZE/BANDAGES/DRESSINGS) ×2 IMPLANT
SUT MNCRL AB 4-0 PS2 18 (SUTURE) ×2 IMPLANT
SUT VICRYL 0 UR6 27IN ABS (SUTURE) IMPLANT
TOWEL GREEN STERILE FF (TOWEL DISPOSABLE) ×2 IMPLANT
TRAY LAPAROSCOPIC (CUSTOM PROCEDURE TRAY) ×2 IMPLANT
TROCAR XCEL BLUNT TIP 100MML (ENDOMECHANICALS) ×2 IMPLANT
TROCAR XCEL NON-BLD 11X100MML (ENDOMECHANICALS) ×2 IMPLANT
TROCAR XCEL NON-BLD 5MMX100MML (ENDOMECHANICALS) ×2 IMPLANT

## 2020-08-18 NOTE — Transfer of Care (Signed)
Immediate Anesthesia Transfer of Care Note  Patient: Brittany Kerr  Procedure(s) Performed: LAPAROSCOPIC CHOLECYSTECTOMY WITH INTRAOPERATIVE CHOLANGIOGRAM (N/A Abdomen)  Patient Location: PACU  Anesthesia Type:General  Level of Consciousness: awake, alert  and oriented  Airway & Oxygen Therapy: Patient Spontanous Breathing and Patient connected to face mask oxygen  Post-op Assessment: Report given to RN and Post -op Vital signs reviewed and stable  Post vital signs: Reviewed and stable  Last Vitals:  Vitals Value Taken Time  BP 135/82 08/18/20 1225  Temp    Pulse 96 08/18/20 1229  Resp 17 08/18/20 1228  SpO2 99 % 08/18/20 1229  Vitals shown include unvalidated device data.  Last Pain:  Vitals:   08/18/20 0847  TempSrc: Oral  PainSc: 0-No pain      Patients Stated Pain Goal: 2 (08/18/20 0847)  Complications: No complications documented.

## 2020-08-18 NOTE — Op Note (Addendum)
Laparoscopic Cholecystectomy with IOC Procedure Note  Indications: This patient presents with symptomatic gallbladder disease and will undergo laparoscopic cholecystectomy.  Pre-operative Diagnosis: Symptomatic cholelithiasis   Post-operative Diagnosis: Same  Surgeon: Manus Rudd, MD  Assistants: Raina Mina, MD   I was present for the critical and key portions of the surgery and I was immediately available throughout the entire procedure.  I have reviewed and agree with the operative note as documented by the resident.   Anesthesia: General endotracheal anesthesia  Procedure Details  The patient was seen again in the Holding Room. The risks, benefits, complications, treatment options, and expected outcomes were discussed with the patient. The possibilities of reaction to medication, pulmonary aspiration, perforation of viscus, bleeding, recurrent infection, finding a normal gallbladder, the need for additional procedures, failure to diagnose a condition, the possible need to convert to an open procedure, and creating a complication requiring transfusion or operation were discussed with the patient. The likelihood of improving the patient's symptoms with return to their baseline status is good.  The patient and/or family concurred with the proposed plan, giving informed consent. The site of surgery properly noted. The patient was taken to Operating Room, identified as Brittany Kerr and the procedure verified as Laparoscopic Cholecystectomy with Intraoperative Cholangiogram. A Time Out was held and the above information confirmed.  Prior to the induction of general anesthesia, antibiotic prophylaxis was administered. After the induction, the abdomen was prepped with Chloraprep and draped in the sterile fashion. The patient was positioned in the supine position.  Local anesthetic agent was injected into the skin near the umbilicus and an infraumbilical incision made. We dissected down to  the abdominal fascia with blunt dissection.  The fascia was incised vertically and we entered the peritoneal cavity bluntly.  A pursestring suture of 0-Vicryl was placed around the fascial opening.  The Hasson cannula was inserted and secured with the stay suture.  Pneumoperitoneum was then created with CO2 and tolerated well without any adverse changes in the patient's vital signs. An 11-mm port was placed in the subxiphoid position.  Two 5-mm ports were placed in the right upper quadrant. All skin incisions were infiltrated with a local anesthetic agent before making the incision and placing the trocars.   We positioned the patient in reverse Trendelenburg, tilted slightly to the patient's left.  The gallbladder was identified, the fundus grasped and retracted cephalad. Adhesions were lysed bluntly and with the electrocautery where indicated, taking care not to injure any adjacent organs or viscus. The infundibulum was grasped and retracted laterally, exposing the peritoneum overlying the triangle of Calot. This was then divided and exposed in a blunt fashion. Several small crossing branches of the cystic artery were cauterized. A critical view of the cystic duct and cystic artery was obtained.  The cystic duct was clearly identified and bluntly dissected circumferentially. The cystic duct was ligated with a clip distally. An incision was made in the cystic duct and the [facility] cholangiogram catheter introduced. The catheter was secured using a clip. A cholangiogram was then obtained which showed good visualization of the distal and proximal biliary tree with no sign of filling defects or obstruction.  Contrast flowed easily into the duodenum. The catheter was then removed.   The cystic duct was then ligated with clips and divided. The cystic artery was identified, dissected free, ligated with clips and divided as well.   The gallbladder was dissected from the liver bed in retrograde fashion with the  electrocautery. The gallbladder was removed  and placed in an Endocatch sac. The liver bed was irrigated and inspected. Hemostasis was achieved with the electrocautery. Copious irrigation was utilized and was repeatedly aspirated until clear.  The gallbladder and Endocatch sac were then removed through the umbilical port site.  The pursestring suture was used to close the umbilical fascia.    We again inspected the right upper quadrant for hemostasis. Pneumoperitoneum was released as we removed the trocars. 4-0 Monocryl was used to close the skin. Benzoin, steri-strips, and clean dressings were applied. The patient was then extubated and brought to the recovery room in stable condition. Instrument, sponge, and needle counts were correct at closure and at the conclusion of the case.   Findings: Cholelithiasis without cholecystitis   Estimated Blood Loss: less than 50 mL         Drains: none          Specimens: Gallbladder           Complications: None; patient tolerated the procedure well.         Disposition: PACU - hemodynamically stable.         Condition: stable

## 2020-08-18 NOTE — Discharge Instructions (Signed)
[facility] ______[facility] LAPAROSCOPIC SURGERY: POST OP INSTRUCTIONS Always review your discharge instruction sheet given to you by the facility where your surgery was performed. IF YOU HAVE DISABILITY OR FAMILY LEAVE FORMS, YOU MUST BRING THEM TO THE OFFICE FOR PROCESSING.   DO NOT GIVE THEM TO YOUR DOCTOR.  1. A prescription for pain medication may be given to you upon discharge.  Take your pain medication as prescribed, if needed.  If narcotic pain medicine is not needed, then you may take acetaminophen (Tylenol) or ibuprofen (Advil) as needed. 2. Take your usually prescribed medications unless otherwise directed. 3. If you need a refill on your pain medication, please contact your pharmacy.  They will contact our office to request authorization. Prescriptions will not be filled after 5pm or on week-ends. 4. You should follow a light diet the first few days after arrival home, such as soup and crackers, etc.  Be sure to include lots of fluids daily. 5. Most patients will experience some swelling and bruising in the area of the incisions.  Ice packs will help.  Swelling and bruising can take several days to resolve.  6. It is common to experience some constipation if taking pain medication after surgery.  Increasing fluid intake and taking a stool softener (such as Colace) will usually help or prevent this problem from occurring.  A mild laxative (Milk of Magnesia or Miralax) should be taken according to package instructions if there are no bowel movements after 48 hours. 7. Unless discharge instructions indicate otherwise, you may remove your bandages 24-48 hours after surgery, and you may shower at that time.  You may have steri-strips (small skin tapes) in place directly over the incision.  These strips should be left on the skin for 7-10 days.  If your surgeon used skin glue on the incision, you may shower in 24 hours.  The glue will flake off over the next 2-3 weeks.  Any sutures or  staples will be removed at the office during your follow-up visit. 8. ACTIVITIES:  You may resume regular (light) daily activities beginning the next day--such as daily self-care, walking, climbing stairs--gradually increasing activities as tolerated.  You may have sexual intercourse when it is comfortable.  Refrain from any heavy lifting or straining until approved by your doctor. a. You may drive when you are no longer taking prescription pain medication, you can comfortably wear a seatbelt, and you can safely maneuver your car and apply brakes. b. RETURN TO WORK:  __________________________________________________________ 9. You should see your doctor in the office for a follow-up appointment approximately 2-3 weeks after your surgery.  Make sure that you call for this appointment within a day or two after you arrive home to insure a convenient appointment time. 10. OTHER INSTRUCTIONS: __________________________________________________________________________________________________________________________ __________________________________________________________________________________________________________________________ WHEN TO CALL YOUR DOCTOR: 1. Fever over 101.0 2. Inability to urinate 3. Continued bleeding from incision. 4. Increased pain, redness, or drainage from the incision. 5. Increasing abdominal pain  The clinic staff is available to answer your questions during regular business hours.  Please don't hesitate to call and ask to speak to one of the nurses for clinical concerns.  If you have a medical emergency, go to the nearest emergency room or call 911.  A surgeon from [facility] is always on call at the hospital. [address] [place], [place], [place]  [address] ? [address], [place], [place]   [address] [phone] ? [phone] ? FAX [phone] Web site:  [email or web address]  Post Anesthesia Home Care Instructions  Activity: Get  plenty of rest for the remainder of the day. A responsible individual must stay with you for 24 hours following the procedure.  For the next 24 hours, DO NOT: -Drive a car -Advertising copywriter -Drink alcoholic beverages -Take any medication unless instructed by your physician -Make any legal decisions or sign important papers.  Meals: Start with liquid foods such as gelatin or soup. Progress to regular foods as tolerated. Avoid greasy, spicy, heavy foods. If nausea and/or vomiting occur, drink only clear liquids until the nausea and/or vomiting subsides. Call your physician if vomiting continues.  Special Instructions/Symptoms: Your throat may feel dry or sore from the anesthesia or the breathing tube placed in your throat during surgery. If this causes discomfort, gargle with warm salt water. The discomfort should disappear within 24 hours.  If you had a scopolamine patch placed behind your ear for the management of post- operative nausea and/or vomiting:  1. The medication in the patch is effective for 72 hours, after which it should be removed.  Wrap patch in a tissue and discard in the trash. Wash hands thoroughly with soap and water. 2. You may remove the patch earlier than 72 hours if you experience unpleasant side effects which may include dry mouth, dizziness or visual disturbances. 3. Avoid touching the patch. Wash your hands with soap and water after contact with the patch.    You had 5 mg of Oxycodone given at 1:04PM. Next dose of NSAID (Ibuprofen, Aleve, Motrin) can be given after 6:00PM.

## 2020-08-18 NOTE — Anesthesia Procedure Notes (Signed)
Procedure Name: Intubation Date/Time: 08/18/2020 10:42 AM Performed by: Lavonia Dana, CRNA Pre-anesthesia Checklist: Patient identified, Emergency Drugs available, Suction available and Patient being monitored Patient Re-evaluated:Patient Re-evaluated prior to induction Oxygen Delivery Method: Circle system utilized Preoxygenation: Pre-oxygenation with 100% oxygen Induction Type: IV induction Ventilation: Mask ventilation without difficulty and Oral airway inserted - appropriate to patient size Laryngoscope Size: Mac and 3 Grade View: Grade I Tube type: Oral Tube size: 7.0 mm Number of attempts: 1 Airway Equipment and Method: Stylet,  Oral airway and Bite block Placement Confirmation: ETT inserted through vocal cords under direct vision,  positive ETCO2 and breath sounds checked- equal and bilateral Secured at: 22 cm Tube secured with: Tape Dental Injury: Teeth and Oropharynx as per pre-operative assessment

## 2020-08-18 NOTE — Anesthesia Preprocedure Evaluation (Signed)
Anesthesia Evaluation  Patient identified by MRN, date of birth, ID band Patient awake    Reviewed: Allergy & Precautions, NPO status , Patient's Chart, lab work & pertinent test results  History of Anesthesia Complications Negative for: history of anesthetic complications  Airway Mallampati: III  TM Distance: >3 FB Neck ROM: Full    Dental  (+) Dental Advisory Given, Teeth Intact   Pulmonary neg pulmonary ROS, neg sleep apnea, neg recent URI,  Covid-19 Nucleic Acid Test Results Lab Results      Component                Value               Date                      SARSCOV2NAA              NEGATIVE            08/15/2020              breath sounds clear to auscultation       Cardiovascular negative cardio ROS   Rhythm:Regular     Neuro/Psych negative neurological ROS     GI/Hepatic Neg liver ROS, GERD  Medicated,gallstones   Endo/Other  negative endocrine ROSNo results found for: HGBA1C   Renal/GU Lab Results      Component                Value               Date                      CREATININE               0.65                02/17/2020           Lab Results      Component                Value               Date                      K                        4.2                 02/17/2020                Musculoskeletal negative musculoskeletal ROS (+)   Abdominal   Peds  Hematology negative hematology ROS (+) Lab Results      Component                Value               Date                      WBC                      10.2                02/17/2020                HGB  13.6                02/17/2020                HCT                      42.4                02/17/2020                MCV                      88.9                02/17/2020                PLT                      258                 02/17/2020              Anesthesia Other Findings   Reproductive/Obstetrics Lab  Results      Component                Value               Date                      PREGTESTUR               NEGATIVE            08/18/2020                HCG                      <5.0                02/17/2020                                        Anesthesia Physical Anesthesia Plan  ASA: I  Anesthesia Plan: General   Post-op Pain Management:    Induction: Intravenous  PONV Risk Score and Plan: 3 and Ondansetron and Dexamethasone  Airway Management Planned: Oral ETT  Additional Equipment: None  Intra-op Plan:   Post-operative Plan: Extubation in OR  Informed Consent: I have reviewed the patients History and Physical, chart, labs and discussed the procedure including the risks, benefits and alternatives for the proposed anesthesia with the patient or authorized representative who has indicated his/her understanding and acceptance.     Dental advisory given  Plan Discussed with: CRNA and Surgeon  Anesthesia Plan Comments:         Anesthesia Quick Evaluation

## 2020-08-18 NOTE — H&P (Signed)
History of Present Illness  The patient is a 30 year old female who presents for evaluation of gall stones. Referred by Dr. Marca Ancona for chronic cholecystitis  This is a 30 year old female who presents with a one-year history of intermittent epigastric pain radiating through to her back associated with nausea, diarrhea, bloating. This tends to occur after eating. She has had 2 severe episodes that resulted in an assisted emergency department. Lab tests were normal. She has had significant reflux over since the birth of her last child who is now 31 years old. She was referred to GI for evaluation. An ultrasound was obtained which showed a 19 mm gallstone with in a otherwise normal-appearing gallbladder. She is referred to [place] for cholecystectomy. She has been changing her diet. She continues to have mild symptoms.  CLINICAL DATA: Epigastric pain for several months  EXAM: ULTRASOUND ABDOMEN LIMITED RIGHT UPPER QUADRANT  COMPARISON: None.  FINDINGS: Gallbladder:  Gallbladder is well distended with the 19 mm stone within. No wall thickening or pericholecystic fluid is noted.  Common bile duct:  Diameter: 3 mm  Liver:  Diffusely increased in echogenicity consistent with fatty infiltration. Portal vein is patent on color Doppler imaging with normal direction of blood flow towards the liver.  Other: None.  IMPRESSION: Cholelithiasis without complicating factors.  Fatty liver.   Electronically Signed By: Alcide Clever M.D. On: 04/17/2020 16:03   Problem List/Past Medical CHRONIC CHOLECYSTITIS WITH CALCULUS (K80.10)  Past Surgical History  No pertinent past surgical history  Diagnostic Studies History  Colonoscopy never Mammogram never  Allergies  No Known Drug Allergies  Allergies Reconciled  Medication History  Omeprazole (10MG  Capsule DR, Oral) Active. Medications Reconciled  Social History Caffeine use Carbonated beverages,  Tea. Tobacco use Never smoker.  Family History  Arthritis Mother. Diabetes Mellitus Mother.  Pregnancy / Birth History  Age at menarche 15 years. Contraceptive History Intrauterine device. Gravida 2 Maternal age 53-25 Regular periods  Other Problems  Cholelithiasis Gastroesophageal Reflux Disease     Review of Systems  General Present- Fatigue and Weight Gain. Not Present- Appetite Loss, Chills, Fever, Night Sweats and Weight Loss. Skin Not Present- Change in Wart/Mole, Dryness, Hives, Jaundice, New Lesions, Non-Healing Wounds, Rash and Ulcer. HEENT Not Present- Earache, Hearing Loss, Hoarseness, Nose Bleed, Oral Ulcers, Ringing in the Ears, Seasonal Allergies, Sinus Pain, Sore Throat, Visual Disturbances, Wears glasses/contact lenses and Yellow Eyes. Gastrointestinal Present- Abdominal Pain, Change in Bowel Habits, Indigestion and Nausea. Not Present- Bloating, Bloody Stool, Chronic diarrhea, Constipation, Difficulty Swallowing, Excessive gas, Gets full quickly at meals, Hemorrhoids, Rectal Pain and Vomiting. Endocrine Not Present- Cold Intolerance, Excessive Hunger, Hair Changes, Heat Intolerance, Hot flashes and New Diabetes. Hematology Not Present- Blood Thinners, Easy Bruising, Excessive bleeding, Gland problems, HIV and Persistent Infections.  Vitals  Weight: 208.5 lb Temp.: 110F  Pulse: 100 (Regular)  P.OX: 96% (Room air) BP: 110/70(Sitting, Left Arm, Standard)        Physical Exam   The physical exam findings are as follows: Note:Constitutional: WDWN in NAD, conversant, no obvious deformities; resting comfortably Eyes: Pupils equal, round; sclera anicteric; moist conjunctiva; no lid lag HENT: Oral mucosa moist; good dentition Neck: No masses palpated, trachea midline; no thyromegaly Lungs: CTA bilaterally; normal respiratory effort CV: Regular rate and rhythm; no murmurs; extremities well-perfused with no edema Abd: +bowel  sounds, soft, minimal RUQ tenderness, no palpable organomegaly; no palpable hernias Musc: Normal gait; no apparent clubbing or cyanosis in extremities Lymphatic: No palpable cervical or axillary lymphadenopathy Skin: Warm,  dry; no sign of jaundice Psychiatric - alert and oriented x 4; calm mood and affect    Assessment & Plan   CHRONIC CHOLECYSTITIS WITH CALCULUS (K80.10)  Current Plans Schedule for Surgery - Laparoscopic cholecystectomy with intraoperative cholangiogram. The surgical procedure has been discussed with the patient. Potential risks, benefits, alternative treatments, and expected outcomes have been explained. All of the patient's questions at this time have been answered. The likelihood of reaching the patient's treatment goal is good. The patient understand the proposed surgical procedure and wishes to proceed.   Brittany Arms. Corliss Skains, MD, [facility]  General/ Trauma Surgery   08/18/2020 10:08 AM

## 2020-08-19 ENCOUNTER — Encounter (HOSPITAL_BASED_OUTPATIENT_CLINIC_OR_DEPARTMENT_OTHER): Payer: Self-pay | Admitting: Surgery

## 2020-08-19 LAB — SURGICAL PATHOLOGY

## 2020-08-24 NOTE — Anesthesia Postprocedure Evaluation (Signed)
Anesthesia Post Note  Patient: Brittany Kerr  Procedure(s) Performed: LAPAROSCOPIC CHOLECYSTECTOMY WITH INTRAOPERATIVE CHOLANGIOGRAM (N/A Abdomen)     Patient location during evaluation: PACU Anesthesia Type: General Level of consciousness: awake and alert Pain management: pain level controlled Vital Signs Assessment: post-procedure vital signs reviewed and stable Respiratory status: spontaneous breathing, nonlabored ventilation, respiratory function stable and patient connected to nasal cannula oxygen Cardiovascular status: blood pressure returned to baseline and stable Postop Assessment: no apparent nausea or vomiting Anesthetic complications: no   No complications documented.  Last Vitals:  Vitals:   08/18/20 1315 08/18/20 1431  BP: 116/69 132/83  Pulse: 87 63  Resp: 15 17  Temp:  37.4 C  SpO2: 97% 96%    Last Pain:  Vitals:   08/19/20 1102  TempSrc:   PainSc: 5                  Avyay Coger
